# Patient Record
Sex: Female | Born: 1995 | Race: White | Hispanic: No | Marital: Married | Smoking: Current every day smoker
Health system: Southern US, Community
[De-identification: ages and names within clinical notes are randomized; demographics above are authoritative.]

## PROBLEM LIST (undated history)

## (undated) DIAGNOSIS — F32A Depression, unspecified: Secondary | ICD-10-CM

## (undated) DIAGNOSIS — F329 Major depressive disorder, single episode, unspecified: Secondary | ICD-10-CM

## (undated) HISTORY — DX: Major depressive disorder, single episode, unspecified: F32.9

## (undated) HISTORY — DX: Depression, unspecified: F32.A

## (undated) HISTORY — PX: OTHER SURGICAL HISTORY: SHX169

---

## 2016-06-07 ENCOUNTER — Ambulatory Visit (INDEPENDENT_AMBULATORY_CARE_PROVIDER_SITE_OTHER): Payer: BLUE CROSS/BLUE SHIELD | Admitting: Adult Health

## 2016-06-07 ENCOUNTER — Encounter: Payer: Self-pay | Admitting: Gastroenterology

## 2016-06-07 ENCOUNTER — Encounter: Payer: Self-pay | Admitting: Adult Health

## 2016-06-07 VITALS — BP 120/73 | HR 84 | Ht 65.75 in | Wt 136.8 lb

## 2016-06-07 DIAGNOSIS — R634 Abnormal weight loss: Secondary | ICD-10-CM | POA: Insufficient documentation

## 2016-06-07 DIAGNOSIS — R1013 Epigastric pain: Secondary | ICD-10-CM | POA: Diagnosis not present

## 2016-06-07 DIAGNOSIS — Z1321 Encounter for screening for nutritional disorder: Secondary | ICD-10-CM | POA: Insufficient documentation

## 2016-06-07 DIAGNOSIS — Z833 Family history of diabetes mellitus: Secondary | ICD-10-CM | POA: Insufficient documentation

## 2016-06-07 DIAGNOSIS — Z720 Tobacco use: Secondary | ICD-10-CM | POA: Insufficient documentation

## 2016-06-07 DIAGNOSIS — R5383 Other fatigue: Secondary | ICD-10-CM | POA: Insufficient documentation

## 2016-06-07 DIAGNOSIS — Z0000 Encounter for general adult medical examination without abnormal findings: Secondary | ICD-10-CM

## 2016-06-07 LAB — POCT GLYCOSYLATED HEMOGLOBIN (HGB A1C): Hemoglobin A1C: 4.9

## 2016-06-07 NOTE — Assessment & Plan Note (Signed)
Continue healthy eating and regular movement. GI referral placed for unexplained 60 lb. Weight loss and diffuse abdominal pain.

## 2016-06-07 NOTE — Patient Instructions (Signed)
Healthy Eating to Prevent Digestive Disorders The digestive system starts at the mouth and goes all the way down to the rectum. Along the way, your digestive system breaks down the food you eat so you can absorb its nutrients and use them for energy. Digestive disorders can cause gas, bloating, pain, heartburn, and other symptoms. They can prevent your digestive system from doing its job. Healthy eating and a healthy lifestyle can help you avoid many common digestive disorders. What nutrition changes can be made? Start by eating a balanced diet. Eat healthy foods from all the major food groups. These include carbohydrates, fats, and proteins. Other changes you can make include to:  Eat enough fiber. Fiber is a healthy carbohydrate that cleans out your digestive system. Fiber absorbs water and helps you have regular bowel movements. Fiber comes from plants. To get enough fiber in your diet, eat 4-5 servings of fruits, vegetables, and legumes every day. Include beans and whole grains. Most people should get 20?35 grams of fiber each day.  Drink enough water to keep your urine clear or pale yellow. Water helps your body digest food. It can also help prevent constipation.  Avoid fatty proteins. Full-fat dairy products and fatty meats are hard to digest. Fats you want to avoid are those that get solid at room temperature (saturated fats). Instead of eating these kinds of fats, eat plant-based unsaturated fats found in olives, canola, corn, avocado, and nuts.  If you have trouble with gas, belching, or flatulence, avoid gas-producing foods. These include beans, carbonated beverages, cabbage, cauliflower, and broccoli. If you are lactose intolerant, avoid dairy products or choose lactose-free dairy products.  If you have frequent heartburn, stay away from alcohol, caffeine, fatty foods, chocolate, and peppermint. Avoid lying down within two hours of eating a full meal. Overeating and lying down too soon after  a meal can cause heartburn.  Add probiotics to your diet. Healthy digestion depends on having the right balance of good bacteria in your colon. Probiotics can help restore the balance of good bacteria in your digestive system. Probiotics are live active cultures that are found in yogurt, kefir, and cultured foods like sauerkraut and miso. You can also add good bacteria with probiotic supplements.  Make sure to chew your food slowly and completely.  Instead of eating three large meals each day, eat three small meals with three small snacks. What other changes can I make? You can help your digestive system stay healthy by making these lifestyle changes:  Stay active and exercise every day.  Maintain a healthy weight.  Eat on a regular schedule.  Avoid tight-fitting clothes. They can restrict digestion.  If you have frequent heartburn, raise the head of your bed 2-3 inches (5-7.5 cm).  Do not use any tobacco products, such as cigarettes, chewing tobacco, and e-cigarettes. If you need help quitting, ask your health care provider.  Limit alcohol intake to no more than 1 drink a day for nonpregnant women and 2 drinks a day for men. One drink equals 12 oz of beer, 5 oz of wine, or 1 oz of hard liquor.  Avoid stress. Find ways to reduce stress, such as meditation, exercise, or taking time for activities that relax you. Why should I make these changes? Making these changes will help your digestive system function at its best. A healthy digestive system can help you avoid or improve your management of digestive disorders such as:  Bloating, gas, and flatulence.  Heartburn.  Gastroesophageal reflux disease (GERD).    Peptic ulcer disease.  Hemorrhoids.  Diverticulitis.  Constipation.  Diarrhea.  Gall stones  Irritable bowel syndrome.  Malnutrition.  Fatty liver disease. What can happen if changes are not made? Not making these changes could put you at risk for many conditions  caused by a poor diet or an unhealthy weight, such as heart disease, stroke and diabetes. Where can I get more information? Learn more about healthy eating and digestive disorders by visiting these websites:  Academy of Nutrition and Dietetics: SplashPops.cahttp://www.eatright.org/resources/health/wellness/digestive-health  Centers for Disease Control and Prevention: TanClothes.com.cyhttps://health.gov/dietaryguidelines/2015/  U.S. Department of Health and Human Services: CosmeticsCritic.sihttps://www.womenshealth.gov/files/assets/docs/the-healthy-woman/digestive_health.pdf Summary  A heathy diet can help prevent many digestive disorders.  Eat a balanced diet consisting of fiber, unsaturated fats, lean protein, fruits, and vegetables.  Eat three small meals with three small snacks per day.  Drink plenty of water every day.  Get plenty of exercise and maintain a healthy weight. This information is not intended to replace advice given to you by your health care provider. Make sure you discuss any questions you have with your health care provider. Document Released: 05/07/2015 Document Revised: 09/16/2015 Document Reviewed: 12/22/2015 Elsevier Interactive Patient Education  2017 [employer]   Steps to Quit Smoking Smoking tobacco can be bad for your health. It can also affect almost every organ in your body. Smoking puts you and people around you at risk for many serious long-lasting (chronic) diseases. Quitting smoking is hard, but it is one of the best things that you can do for your health. It is never too late to quit. What are the benefits of quitting smoking? When you quit smoking, you lower your risk for getting serious diseases and conditions. They can include:  Lung cancer or lung disease.  Heart disease.  Stroke.  Heart attack.  Not being able to have children (infertility).  Weak bones (osteoporosis) and broken bones (fractures). If you have coughing, wheezing, and shortness of breath, those symptoms may get  better when you quit. You may also get sick less often. If you are pregnant, quitting smoking can help to lower your chances of having a baby of low birth weight. What can I do to help me quit smoking? Talk with your doctor about what can help you quit smoking. Some things you can do (strategies) include:  Quitting smoking totally, instead of slowly cutting back how much you smoke over a period of time.  Going to in-person counseling. You are more likely to quit if you go to many counseling sessions.  Using resources and support systems, such as:  Online chats with a Veterinary surgeoncounselor.  Phone quitlines.  Printed Materials engineerself-help materials.  Support groups or group counseling.  Text messaging programs.  Mobile phone apps or applications.  Taking medicines. Some of these medicines may have nicotine in them. If you are pregnant or breastfeeding, do not take any medicines to quit smoking unless your doctor says it is okay. Talk with your doctor about counseling or other things that can help you. Talk with your doctor about using more than one strategy at the same time, such as taking medicines while you are also going to in-person counseling. This can help make quitting easier. What things can I do to make it easier to quit? Quitting smoking might feel very hard at first, but there is a lot that you can do to make it easier. Take these steps:  Talk to your family and friends. Ask them to support and encourage you.  Call phone quitlines, reach out  to support groups, or work with a Veterinary surgeon.  Ask people who smoke to not smoke around you.  Avoid places that make you want (trigger) to smoke, such as:  Bars.  Parties.  Smoke-break areas at work.  Spend time with people who do not smoke.  Lower the stress in your life. Stress can make you want to smoke. Try these things to help your stress:  Getting regular exercise.  Deep-breathing exercises.  Yoga.  Meditating.  Doing a body scan. To  do this, close your eyes, focus on one area of your body at a time from head to toe, and notice which parts of your body are tense. Try to relax the muscles in those areas.  Download or buy apps on your mobile phone or tablet that can help you stick to your quit plan. There are many free apps, such as QuitGuide from the [employer] developer for Disease Control and Prevention). You can find more support from smokefree.gov and other websites. This information is not intended to replace advice given to you by your health care provider. Make sure you discuss any questions you have with your health care provider. Document Released: 02/04/2009 Document Revised: 12/07/2015 Document Reviewed: 08/25/2014 Elsevier Interactive Patient Education  2017 Elsevier Inc.   GI referral placed for unexplained weight loss and diffuse abdominal pain. Please schedule appt. For fasting labs and appt for pap. Annual follow-up, or sooner if needed.

## 2016-06-07 NOTE — Progress Notes (Signed)
Subjective:    Patient ID: Christie Leavenslisabeth Greer, female    DOB: 06/03/1995, 21 y.o.   MRN: [id number]  HPI:  [name] presents to establish as a new pt. She has never had a PCP and has a few concerning issues: Nov 2016- Feb 2017-unexplained 60 lb weight loss. 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   Her weight has remained 135-145 the last 12 months. She has diffuse abdominal pain that occurs most days and is dull and rated 2/10-6/10.  She says "that sometimes about 30 mins after eating the pain will back off". She denies fever, blood in stool.  She reports intermittent nausea, however denies vomiting. Her menstrual cycles are regular and not excessviely heavy. She does report night sweats a few times a week and am cough that is occasionally productive (clear sputum). She reports daily am BM that is usually hard and "a bit difficult to pass". Prior to onset of weight loss she denies travel outside the [place], being bitten by a tick. She denies hx of ca in immediate family, mother is diabetic but she believes that is r/t her alcoholism. She currently smokes 1/2 pack day and is trying to "quit by myself".   Patient Care Team    Relationship Specialty Notifications Start End  Malon KindleKaty D Bess, NP PCP - General Family Medicine  06/07/16     Patient Active Problem List   Diagnosis Date Noted  . Weight loss 06/07/2016  . Epigastric pain 06/07/2016  . Family history of diabetes mellitus 06/07/2016  . Routine physical examination 06/07/2016  . Encounter for vitamin deficiency screening 06/07/2016  . Fatigue 06/07/2016  . Tobacco use 06/07/2016     History reviewed. No pertinent past medical history.   History reviewed. No pertinent surgical history.   Family History  Problem Relation Age of Onset  . Alcohol abuse Mother   . Diabetes Mother      History  Drug Use No     History  Alcohol Use No     History  Smoking Status  .  Current Every Day Smoker  . Packs/day: 0.50  . Years: 6.00  . Types: Cigarettes  Smokeless Tobacco  . Never Used     No outpatient encounter prescriptions on file as of 06/07/2016.   No facility-administered encounter medications on file as of 06/07/2016.     Allergies: Penicillins  Body mass index is 22.25 kg/m.  Blood pressure 120/73, pulse 84, height 5' 5.75" (1.67 m), weight 136 lb 12.8 oz (62.1 kg), last menstrual period 05/19/2016.    Review of Systems  Constitutional: Positive for appetite change, fatigue and unexpected weight change. Negative for activity change, chills, diaphoresis and fever.       Unexplained 60 lb weight loss from 02/2015-05/2015  HENT: Positive for congestion and postnasal drip.   Eyes: Negative for visual disturbance.  Respiratory: Positive for cough. Negative for choking, chest tightness, shortness of breath, wheezing and stridor.   Cardiovascular: Negative for chest pain, palpitations and leg swelling.  Gastrointestinal: Positive for abdominal pain, constipation and nausea. Negative for abdominal distention, anal bleeding, blood in stool, diarrhea, rectal pain and vomiting.  Endocrine: Positive for heat intolerance. Negative for cold intolerance, polydipsia, polyphagia and polyuria.  Genitourinary: Negative for difficulty urinating, flank pain, menstrual problem, pelvic pain and urgency.  Musculoskeletal: Negative for arthralgias, back pain, gait problem, joint swelling, myalgias, neck pain and neck stiffness.  Skin: Negative for color  change, pallor, rash and wound.  Neurological: Negative for dizziness.  Hematological: Does not bruise/bleed easily.  Psychiatric/Behavioral: Negative for agitation, behavioral problems and sleep disturbance. The patient is not nervous/anxious.        Objective:   Physical Exam  Constitutional: She is oriented to person, place, and time. She appears well-developed and well-nourished. No distress.  HENT:  Head:  Normocephalic and atraumatic.  Right Ear: External ear normal.  Left Ear: External ear normal.  Eyes: Conjunctivae and EOM are normal. Pupils are equal, round, and reactive to light.  Neck: Normal range of motion. Neck supple. No tracheal deviation present.  Cardiovascular: Normal rate, regular rhythm, normal heart sounds and intact distal pulses.   No murmur heard. Pulmonary/Chest: Effort normal and breath sounds normal. No respiratory distress. She has no wheezes. She has no rales. She exhibits no tenderness.  Abdominal: Soft. Bowel sounds are normal. She exhibits no distension and no mass. There is no hepatosplenomegaly. There is tenderness in the right upper quadrant, right lower quadrant, left upper quadrant and left lower quadrant. There is no rigidity, no rebound, no guarding, no CVA tenderness, no tenderness at McBurney's point and negative Murphy's sign. No hernia.  Musculoskeletal: Normal range of motion. She exhibits deformity.  Right rib cage is slightly higher than left rib cage.    Lymphadenopathy:    She has no cervical adenopathy.  Neurological: She is alert and oriented to person, place, and time. She has normal reflexes.  Skin: Skin is warm and dry. No rash noted. She is not diaphoretic. No erythema. There is pallor.  Psychiatric: She has a normal mood and affect. Her behavior is normal. Judgment and thought content normal.  Nursing note and vitals reviewed.         Assessment & Plan:   1. Weight loss   2. Epigastric pain   3. Family history of diabetes mellitus   4. Routine physical examination   5. Encounter for vitamin deficiency screening   6. Fatigue, unspecified type   7. Tobacco use     Weight loss Continue healthy eating and regular movement. GI referral placed for unexplained 60 lb. Weight loss and diffuse abdominal pain.   Epigastric pain Referral for GI placed.  Family history of diabetes mellitus Unexplained 60 lb. Weight loss 02/2015-05/2015  and mother is T2D. A1c in clinic today- 4.9  Routine physical examination Head to Toe physical completed. Please return for fasting labs, pap, Rx for oral BCP.   Encounter for vitamin deficiency screening Please return for lab draw.  Fatigue Increase daily water intake and regular exercise.  Tobacco use 6 year pack hx.  At most 1 pack/day, currently 1/2/day now. Declines medication to aid in cessation-wants "to do it on my own"-GREAT!    FOLLOW-UP:  Return in about 1 year (around 06/07/2017) for Regular Follow Up, Fasting Lab Draw.

## 2016-06-07 NOTE — Assessment & Plan Note (Signed)
Increase daily water intake and regular exercise.

## 2016-06-07 NOTE — Assessment & Plan Note (Signed)
Head to Toe physical completed. Please return for fasting labs, pap, Rx for oral BCP.

## 2016-06-07 NOTE — Assessment & Plan Note (Signed)
Please return for lab draw.

## 2016-06-07 NOTE — Assessment & Plan Note (Signed)
Unexplained 60 lb. Weight loss 02/2015-05/2015 and mother is T2D. A1c in clinic today- 4.9

## 2016-06-07 NOTE — Assessment & Plan Note (Signed)
Referral for GI placed

## 2016-06-07 NOTE — Assessment & Plan Note (Signed)
6 year pack hx.  At most 1 pack/day, currently 1/2/day now. Declines medication to aid in cessation-wants "to do it on my own"-GREAT!

## 2016-06-09 ENCOUNTER — Other Ambulatory Visit (INDEPENDENT_AMBULATORY_CARE_PROVIDER_SITE_OTHER): Payer: BLUE CROSS/BLUE SHIELD

## 2016-06-09 DIAGNOSIS — Z1321 Encounter for screening for nutritional disorder: Secondary | ICD-10-CM

## 2016-06-09 DIAGNOSIS — R634 Abnormal weight loss: Secondary | ICD-10-CM

## 2016-06-09 DIAGNOSIS — Z0000 Encounter for general adult medical examination without abnormal findings: Secondary | ICD-10-CM

## 2016-06-12 ENCOUNTER — Encounter: Payer: Self-pay | Admitting: Adult Health

## 2016-06-12 ENCOUNTER — Ambulatory Visit (INDEPENDENT_AMBULATORY_CARE_PROVIDER_SITE_OTHER): Payer: BLUE CROSS/BLUE SHIELD | Admitting: Adult Health

## 2016-06-12 ENCOUNTER — Other Ambulatory Visit (HOSPITAL_COMMUNITY)
Admission: RE | Admit: 2016-06-12 | Discharge: 2016-06-12 | Disposition: A | Discharge status: Home / Self Care | Payer: BLUE CROSS/BLUE SHIELD | Source: Ambulatory Visit | Attending: Adult Health | Admitting: Adult Health

## 2016-06-12 VITALS — BP 122/79 | HR 88 | Ht 65.75 in | Wt 137.3 lb

## 2016-06-12 DIAGNOSIS — Z720 Tobacco use: Secondary | ICD-10-CM

## 2016-06-12 DIAGNOSIS — Z833 Family history of diabetes mellitus: Secondary | ICD-10-CM

## 2016-06-12 DIAGNOSIS — R1013 Epigastric pain: Secondary | ICD-10-CM | POA: Diagnosis not present

## 2016-06-12 DIAGNOSIS — Z124 Encounter for screening for malignant neoplasm of cervix: Secondary | ICD-10-CM

## 2016-06-12 DIAGNOSIS — Z0000 Encounter for general adult medical examination without abnormal findings: Secondary | ICD-10-CM

## 2016-06-12 DIAGNOSIS — Z01419 Encounter for gynecological examination (general) (routine) without abnormal findings: Secondary | ICD-10-CM | POA: Insufficient documentation

## 2016-06-12 DIAGNOSIS — Z3041 Encounter for surveillance of contraceptive pills: Secondary | ICD-10-CM

## 2016-06-12 DIAGNOSIS — Z1321 Encounter for screening for nutritional disorder: Secondary | ICD-10-CM

## 2016-06-12 DIAGNOSIS — Z114 Encounter for screening for human immunodeficiency virus [HIV]: Secondary | ICD-10-CM

## 2016-06-12 DIAGNOSIS — R5383 Other fatigue: Secondary | ICD-10-CM

## 2016-06-12 MED ORDER — NORGESTIM-ETH ESTRAD TRIPHASIC 0.18/0.215/0.25 MG-25 MCG PO TABS: 1.0000 | ORAL_TABLET | Freq: Every day | ORAL | 11 refills | Status: DC

## 2016-06-12 NOTE — Progress Notes (Signed)
Subjective:    Patient ID: Christie Greer, female    DOB: 1996-04-01, 21 y.o.   MRN: [id number]  HPI :  Christie Greer presents for pap smear, breast exam, and Rx for oral birth control.  She denies acute breast sx's, or GI/GU sx's.  She is continuing to reduce daily tobacco use, down to 8 cigarettes daily-keep up the great work!  She has appointment scheduled for GI specialist.     Patient Care Team    Relationship Specialty Notifications Start End  Malon KindleKaty D Bess, NP PCP - General Family Medicine  06/07/16     Patient Active Problem List   Diagnosis Date Noted  . Weight loss 06/07/2016  . Epigastric pain 06/07/2016  . Family history of diabetes mellitus 06/07/2016  . Routine physical examination 06/07/2016  . Encounter for vitamin deficiency screening 06/07/2016  . Fatigue 06/07/2016  . Tobacco use 06/07/2016     History reviewed. No pertinent past medical history.   History reviewed. No pertinent surgical history.   Family History  Problem Relation Age of Onset  . Alcohol abuse Mother   . Diabetes Mother      History  Drug Use No     History  Alcohol Use No     History  Smoking Status  . Current Every Day Smoker  . Packs/day: 0.50  . Years: 6.00  . Types: Cigarettes  Smokeless Tobacco  . Never Used     Outpatient Encounter Prescriptions as of 06/12/2016  Medication Sig  . Norgestimate-Ethinyl Estradiol Triphasic (ORTHO TRI-CYCLEN LO) 0.18/0.215/0.25 MG-25 MCG tab Take 1 tablet by mouth daily.   No facility-administered encounter medications on file as of 06/12/2016.     Allergies: Penicillins  Body mass index is 22.33 kg/m.  Blood pressure 122/79, pulse 88, height 5' 5.75" (1.67 m), weight 137 lb 4.8 oz (62.3 kg), last menstrual period 05/19/2016.    Review of Systems  Constitutional: Positive for unexpected weight change. Negative for activity change, appetite change, chills, diaphoresis, fatigue and fever.  Eyes: Negative for visual  disturbance.  Respiratory: Negative for cough and shortness of breath.   Cardiovascular: Negative for chest pain and leg swelling.  Gastrointestinal: Positive for nausea. Negative for abdominal distention, abdominal pain, blood in stool, constipation, diarrhea, rectal pain and vomiting.  Endocrine: Negative for cold intolerance, heat intolerance, polydipsia, polyphagia and polyuria.  Genitourinary: Negative for difficulty urinating, dyspareunia, dysuria, flank pain, genital sores, hematuria, menstrual problem, pelvic pain, urgency, vaginal bleeding, vaginal discharge and vaginal pain.  Skin: Negative for color change, pallor, rash and wound.       Objective:   Physical Exam  Constitutional: She is oriented to person, place, and time. She appears well-developed and well-nourished. No distress.  HENT:  Head: Normocephalic and atraumatic.  Eyes: Conjunctivae and EOM are normal. Pupils are equal, round, and reactive to light.  Cardiovascular: Normal rate, regular rhythm, normal heart sounds and intact distal pulses.   Pulmonary/Chest: Effort normal and breath sounds normal. She has no wheezes. She exhibits no tenderness.  Abdominal: Soft. Bowel sounds are normal. She exhibits no distension. There is no tenderness. There is no rebound and no guarding.  Genitourinary: Uterus normal. No breast swelling, tenderness, discharge or bleeding. No erythema, tenderness or bleeding in the vagina. No foreign body in the vagina. No vaginal discharge found.  Genitourinary Comments: Pap/Pelvic/Breast examination performed with chaperone in room.    Neurological: She is alert and oriented to person, place, and time. She has normal reflexes.  Skin: Skin is warm and dry. No rash noted. She is not diaphoretic. No erythema. No pallor.  Psychiatric: She has a normal mood and affect. Her behavior is normal. Judgment and thought content normal.  Nursing note and vitals reviewed.         Assessment & Plan:   1.  Epigastric pain   2. Family history of diabetes mellitus   3. Encounter for vitamin deficiency screening   4. Routine physical examination   5. Other fatigue   6. Screening for cervical cancer   7. Encounter for screening for HIV   8. Tobacco use   9. Surveillance for birth control, oral contraceptives     Screening for cervical cancer Pap/Pelvic/Breast examination performed with chaperone in room. No vaginal lesions/drainage noted.   Surveillance for birth control, oral contraceptives Rx provided, discussed how to take medication correctly. Discussed at length the CV risks of oral BCP and tobacco use. She has been reducing tobacco use to completed cessation, currently only smoking 8 cigarettes daily. She denies personal or family hx of migraines or DVT.  Routine physical examination Performed manual breast examination. Instructed on how to perform monthly BSE.     FOLLOW-UP:  Return if symptoms worsen or fail to improve.

## 2016-06-12 NOTE — Assessment & Plan Note (Signed)
Rx provided, discussed how to take medication correctly. Discussed at length the CV risks of oral BCP and tobacco use. She has been reducing tobacco use to completed cessation, currently only smoking 8 cigarettes daily. She denies personal or family hx of migraines or DVT.

## 2016-06-12 NOTE — Assessment & Plan Note (Signed)
Performed manual breast examination. Instructed on how to perform monthly BSE.

## 2016-06-12 NOTE — Patient Instructions (Addendum)
Hormonal Contraception Information Introduction Estrogen and progesterone (progestin) are hormones used in many forms of birth control (contraception). These two hormones make up most hormonal contraceptives. Hormonal contraceptives use either:  A combination of estrogen hormone and progesterone hormone in one of these forms:  Pill. Pills come in various combinations of active hormone pills and nonhormonal pills. Different combinations of pills may give you a period once a month, once every 3 months, or no period at all. It is important to take the pills the same time each day.  Patch. The patch is placed on the lower abdomen every week for 3 weeks. On the fourth week, the patch is not placed.  Vaginal ring. The ring is placed in the vagina and left there for 3 weeks. It is then removed for 1 week.  Progesterone alone in one of these forms:  Pill. Hormone pills are taken every day of the cycle.  Intrauterine device (IUD). The IUD is inserted during a menstrual period and removed or replaced every 5 years or sooner.  Implant. Plastic rods are placed under the skin of the upper arm. They are removed or replaced every 3 years or sooner.  Injection. The injection is given once every 90 days. Pregnancy can still occur with any of these hormonal contraceptive methods. If you have any suspicion that you might be pregnant, take a pregnancy test and talk to your health care provider. Estrogen and progesterone contraceptives Estrogen and progesterone contraceptives can prevent pregnancy by:  Stopping the release of an egg (ovulation).  Thickening the mucus of the cervix, making it difficult for sperm to enter the uterus.  Changing the lining of the uterus. This change makes it more difficult for an egg to implant. Progesterone contraceptives Progesterone-only contraceptives can prevent pregnancy by:  Blocking ovulation. This occurs in many women, but some women will continue to  ovulate.  Preventing the entry of sperm into the uterus by keeping the cervical mucus thick and sticky.  Changing the lining of the uterus. This change makes it more difficult for an egg to implant. Side effects Talk to your health care provider about what side effects may affect you. If you develop persistent side effects or if the effects are severe, talk to your health care provider.  Estrogen. Side effects from estrogen occur more often in the first 2-3 months. They include:  Progesterone. Side effects of progesterone can vary. They include: Questions to ask This information is not intended to replace advice given to you by your health care provider. Make sure you discuss any questions you have with your health care provider. Document Released: 04/30/2007 Document Revised: 01/12/2016 Document Reviewed: 09/22/2012  2017 Elsevier   Tobacco Use Disorder Tobacco use disorder (TUD) is a mental disorder. It is the long-term use of tobacco in spite of related health problems or difficulty with normal life activities. Tobacco is most commonly smoked as cigarettes and less commonly as cigars or pipes. Smokeless chewing tobacco and snuff are also popular. People with TUD get a feeling of extreme pleasure (euphoria) from using tobacco and have a desire to use it again and again. Repeated use of tobacco can cause problems. The addictive effects of tobacco are due mainly tothe ingredient nicotine. Nicotine also causes a rush of adrenaline (epinephrine) in the body. This leads to increased blood pressure, heart rate, and breathing rate. These changes may cause problems for people with high blood pressure, weak hearts, or lung disease. High doses of nicotine in children and pets  can lead to seizures and death. Tobacco contains a number of other unsafe chemicals. These chemicals are especially harmful when inhaled as smoke and can damage almost every organ in the body. Smokers live shorter lives than  nonsmokers and are at risk of dying from a number of diseases and cancers. Tobacco smoke can also cause health problems for nonsmokers (due to inhaling secondhand smoke). Smoking is also a fire hazard. TUD usually starts in the late teenage years and is most common in young adults between the ages of 4218 and 25 years. People who start smoking earlier in life are more likely to continue smoking as adults. TUD is somewhat more common in men than women. People with TUD are at higher risk for using alcohol and other drugs of abuse. What increases the risk? Risk factors for TUD include:  Having family members with the disorder.  Being around people who use tobacco.  Having an existing mental health issue such as schizophrenia, depression, bipolar disorder, ADHD, or posttraumatic stress disorder (PTSD). What are the signs or symptoms? People with tobacco use disorder have two or more of the following signs and symptoms within 12 months:  Use of more tobacco over a longer period than intended.  Not able to cut down or control tobacco use.  A lot of time spent obtaining or using tobacco.  Strong desire or urge to use tobacco (craving). Cravings may last for 6 months or longer after quitting.  Use of tobacco even when use leads to major problems at work, school, or home.  Use of tobacco even when use leads to relationship problems.  Giving up or cutting down on important life activities because of tobacco use.  Repeatedly using tobacco in situations where it puts you or others in physical danger, like smoking in bed.  Use of tobacco even when it is known that a physical or mental problem is likely related to tobacco use.  Physical problems are numerous and may include chronic bronchitis, emphysema, lung and other cancers, gum disease, high blood pressure, heart disease, and stroke.  Mental problems caused by tobacco may include difficulty sleeping and anxiety.  Need to use greater amounts of  tobacco to get the same effect. This means you have developed a tolerance.  Withdrawal symptoms as a result of stopping or rapidly cutting back use. These symptoms may last a month or more after quitting and include the following:  Depressed, anxious, or irritable mood.  Difficulty concentrating.  Increased appetite.  Restlessness or trouble sleeping.  Use of tobacco to avoid withdrawal symptoms. How is this diagnosed? Tobacco use disorder is diagnosed by your health care provider. A diagnosis may be made by:  Your health care provider asking questions about your tobacco use and any problems it may be causing.  A physical exam.  Lab tests.  You may be referred to a mental health professional or addiction specialist. The severity of tobacco use disorder depends on the number of signs and symptoms you have:  Mild-Two or three symptoms.  Moderate-Four or five symptoms.  Severe-Six or more symptoms. How is this treated? Many people with tobacco use disorder are unable to quit on their own and need help. Treatment options include the following:  Nicotine replacement therapy (NRT). NRT provides nicotine without the other harmful chemicals in tobacco. NRT gradually lowers the dosage of nicotine in the body and reduces withdrawal symptoms. NRT is available in over-the-counter forms (gum, lozenges, and skin patches) as well as prescription forms (mouth  inhaler and nasal spray).  Medicines.This may include:  Antidepressant medicine that may reduce nicotine cravings.  A medicine that acts on nicotine receptors in the brain to reduce cravings and withdrawal symptoms. It may also block the effects of tobacco in people with TUD who relapse.  Counseling or talk therapy. A form of talk therapy called behavioral therapy is commonly used to treat people with TUD. Behavioral therapy looks at triggers for tobacco use, how to avoid them, and how to cope with cravings. It is most effective in  person or by phone but is also available in self-help forms (books and Internet websites).  Support groups. These provide emotional support, advice, and guidance for quitting tobacco. The most effective treatment for TUD is usually a combination of medicine, talk therapy, and support groups. Follow these instructions at home:  Keep all follow-up visits as directed by your health care provider. This is important.  Take medicines only as directed by your health care provider.  Check with your health care provider before starting new prescription or over-the-counter medicines. Contact a health care provider if:  You are not able to take your medicines as prescribed.  Treatment is not helping your TUD and your symptoms get worse. Get help right away if:  You have serious thoughts about hurting yourself or others.  You have trouble breathing, chest pain, sudden weakness, or sudden numbness in part of your body. This information is not intended to replace advice given to you by your health care provider. Make sure you discuss any questions you have with your health care provider. Document Released: 12/15/2003 Document Revised: 12/12/2015 Document Reviewed: 06/06/2013 Elsevier Interactive Patient Education  2017 [employer].  Again discussed the importance of completely stopped all tobacco use-down to only 8 cigarettes daily. Will call when lab results are available.

## 2016-06-12 NOTE — Assessment & Plan Note (Signed)
Pap/Pelvic/Breast examination performed with chaperone in room. No vaginal lesions/drainage noted.

## 2016-06-13 LAB — CBC WITH DIFFERENTIAL/PLATELET
BASOS ABS: 0 10*3/uL (ref 0.0–0.2)
Basos: 0 %
EOS (ABSOLUTE): 0.1 10*3/uL (ref 0.0–0.4)
Eos: 3 %
HEMOGLOBIN: 14.1 g/dL (ref 11.1–15.9)
Hematocrit: 41.6 % (ref 34.0–46.6)
IMMATURE GRANS (ABS): 0 10*3/uL (ref 0.0–0.1)
IMMATURE GRANULOCYTES: 0 %
LYMPHS: 24 %
Lymphocytes Absolute: 1.3 10*3/uL (ref 0.7–3.1)
MCH: 28.5 pg (ref 26.6–33.0)
MCHC: 33.9 g/dL (ref 31.5–35.7)
MCV: 84 fL (ref 79–97)
MONOCYTES: 6 %
Monocytes Absolute: 0.3 10*3/uL (ref 0.1–0.9)
NEUTROS ABS: 3.5 10*3/uL (ref 1.4–7.0)
Neutrophils: 67 %
Platelets: 260 10*3/uL (ref 150–379)
RBC: 4.95 x10E6/uL (ref 3.77–5.28)
RDW: 13.7 % (ref 12.3–15.4)
WBC: 5.3 10*3/uL (ref 3.4–10.8)

## 2016-06-13 LAB — LIPID PANEL
CHOL/HDL RATIO: 2.8 ratio (ref 0.0–4.4)
Cholesterol, Total: 141 mg/dL (ref 100–199)
HDL: 51 mg/dL (ref >39)
LDL Calculated: 80 mg/dL (ref 0–99)
TRIGLYCERIDES: 51 mg/dL (ref 0–149)
VLDL CHOLESTEROL CAL: 10 mg/dL (ref 5–40)

## 2016-06-13 LAB — COMPREHENSIVE METABOLIC PANEL
ALBUMIN: 4.7 g/dL (ref 3.5–5.5)
ALT: 8 IU/L (ref 0–32)
AST: 16 IU/L (ref 0–40)
Albumin/Globulin Ratio: 2.2 (ref 1.2–2.2)
Alkaline Phosphatase: 48 IU/L (ref 39–117)
BUN / CREAT RATIO: 12 (ref 9–23)
BUN: 8 mg/dL (ref 6–20)
Bilirubin Total: 0.4 mg/dL (ref 0.0–1.2)
CALCIUM: 9.9 mg/dL (ref 8.7–10.2)
CO2: 24 mmol/L (ref 18–29)
CREATININE: 0.69 mg/dL (ref 0.57–1.00)
Chloride: 100 mmol/L (ref 96–106)
GFR calc non Af Amer: 124 mL/min/{1.73_m2} (ref >59)
GFR, EST AFRICAN AMERICAN: 143 mL/min/{1.73_m2} (ref >59)
GLUCOSE: 75 mg/dL (ref 65–99)
Globulin, Total: 2.1 g/dL (ref 1.5–4.5)
Potassium: 4.7 mmol/L (ref 3.5–5.2)
Sodium: 141 mmol/L (ref 134–144)
TOTAL PROTEIN: 6.8 g/dL (ref 6.0–8.5)

## 2016-06-13 LAB — TSH: TSH: 0.9 u[IU]/mL (ref 0.450–4.500)

## 2016-06-13 LAB — HIV ANTIBODY (ROUTINE TESTING W REFLEX): HIV Screen 4th Generation wRfx: NONREACTIVE

## 2016-06-13 LAB — VITAMIN D 25 HYDROXY (VIT D DEFICIENCY, FRACTURES): VIT D 25 HYDROXY: 28.3 ng/mL — AB (ref 30.0–100.0)

## 2016-06-14 LAB — CYTOLOGY - PAP
Adequacy: ABSENT
Diagnosis: NEGATIVE

## 2016-07-11 ENCOUNTER — Encounter: Payer: Self-pay | Admitting: Gastroenterology

## 2016-07-11 ENCOUNTER — Ambulatory Visit (INDEPENDENT_AMBULATORY_CARE_PROVIDER_SITE_OTHER): Payer: BLUE CROSS/BLUE SHIELD | Admitting: Gastroenterology

## 2016-07-11 VITALS — BP 90/62 | HR 72 | Ht 65.75 in | Wt 137.0 lb

## 2016-07-11 DIAGNOSIS — R1012 Left upper quadrant pain: Secondary | ICD-10-CM

## 2016-07-11 DIAGNOSIS — R634 Abnormal weight loss: Secondary | ICD-10-CM | POA: Diagnosis not present

## 2016-07-11 DIAGNOSIS — R194 Change in bowel habit: Secondary | ICD-10-CM

## 2016-07-11 NOTE — Progress Notes (Signed)
[facility] Consult Note:  History: Christie Greer 07/11/2016  Referring physician: Laurance Flatten, NP  Reason for consult/chief complaint: Abdominal Pain (left side since 02/2015 and rapid wt loss over 4 mos. of 60 lbs)   Subjective  HPI:  This is a 21 year old woman referred for abdominal pain and previous weight loss. She reports that in late 2016 and early 2017 she lost 60 pounds unintentionally. She also recalls the beginning of some frequent left-sided abdominal pain during that same period. The weight loss was of unknown cause, and then it stopped. Her weight has been stable for years now. However, she continues to have frequent left-sided abdominal pain that would be sometimes sharp and sometimes dull. It is nonradiating, seems unrelated to any clear triggers such as eating time of day position or bowel movements. Curiously, when she has a large meal it seems to get better. She tends toward constipation with some diarrhea perhaps every couple of weeks. There's been no rectal bleeding, she denies early satiety, nausea, vomiting, dysphagia.   ROS:  Review of Systems  Constitutional: Negative for appetite change and unexpected weight change.  HENT: Negative for mouth sores and voice change.   Eyes: Negative for pain and redness.  Respiratory: Negative for cough and shortness of breath.   Cardiovascular: Negative for chest pain and palpitations.  Genitourinary: Negative for dysuria and hematuria.  Musculoskeletal: Negative for arthralgias and myalgias.  Skin: Negative for pallor and rash.  Neurological: Negative for weakness and headaches.  Hematological: Negative for adenopathy.     Past Medical History: Past Medical History:  Diagnosis Date  . Depression      Past Surgical History: Past Surgical History:  Procedure Laterality Date  . None       Family History: Family History  Problem Relation Age of Onset  . Alcohol abuse Mother    recovering alcoholic  . Diabetes Mother   . Colon cancer Paternal Grandmother   . Breast cancer Paternal Grandmother     Social History: Social History   Social History  . Marital status: Single    Spouse name: N/A  . Number of children: 0  . Years of education: N/A   Occupational History  . office manager    Social History Main Topics  . Smoking status: Current Every Day Smoker    Packs/day: 0.50    Years: 6.00    Types: Cigarettes  . Smokeless tobacco: Never Used  . Alcohol use No     Comment: one drink every few weeks  . Drug use: Yes    Types: Marijuana     Comment: from time to time  . Sexual activity: Yes    Birth control/ protection: Condom   Other Topics Concern  . None   Social History Narrative  . None    Allergies: Allergies  Allergen Reactions  . Penicillins Other (See Comments)    Never has taken but 2 sisters with reactions    Outpatient Meds: Current Outpatient Prescriptions  Medication Sig Dispense Refill  . Norgestimate-Ethinyl Estradiol Triphasic (ORTHO TRI-CYCLEN LO) 0.18/0.215/0.25 MG-25 MCG tab Take 1 tablet by mouth daily. 1 Package 11   No current facility-administered medications for this visit.       ___________________________________________________________________ Objective   Exam:  BP 90/62   Pulse 72   Ht 5' 5.75" (1.67 m)   Wt 137 lb (62.1 kg)   BMI 22.28 kg/m    General: this is a(n) well-appearing young woman, good muscle mass  Eyes: sclera anicteric, no redness  ENT: oral mucosa moist without lesions, no cervical or supraclavicular lymphadenopathy, good dentition  CV: RRR without murmur, S1/S2, no JVD, no peripheral edema  Resp: clear to auscultation bilaterally, normal RR and effort noted  GI: soft, no tenderness, with active bowel sounds. No guarding or palpable organomegaly noted. She has extra skin on the abdominal wall and flank says if she had lost a significant amount of weight.  Skin; warm and  dry, no rash or jaundice noted  Neuro: awake, alert and oriented x 3. Normal gross motor function and fluent speech  Labs:  CMP Latest Ref Rng & Units 06/12/2016  Glucose 65 - 99 mg/dL 75  BUN 6 - 20 mg/dL 8  Creatinine 1.610.57 - 0.961.00 mg/dL 0.450.69  Sodium 409134 - 811144 mmol/L 141  Potassium 3.5 - 5.2 mmol/L 4.7  Chloride 96 - 106 mmol/L 100  CO2 18 - 29 mmol/L 24  Calcium 8.7 - 10.2 mg/dL 9.9  Total Protein 6.0 - 8.5 g/dL 6.8  Total Bilirubin 0.0 - 1.2 mg/dL 0.4  Alkaline Phos 39 - 117 IU/L 48  AST 0 - 40 IU/L 16  ALT 0 - 32 IU/L 8   nml TSH  CBC Latest Ref Rng & Units 06/12/2016  WBC 3.4 - 10.8 x10E3/uL 5.3  Hematocrit 34.0 - 46.6 % 41.6  Platelets 150 - 379 x10E3/uL 260    Assessment: Encounter Diagnoses  Name Primary?  . LUQ pain Yes  . Weight loss   . Altered bowel habits     It is not clear if the abdominal pain and weight loss are related. They would seem unlikely to be since the weight loss, though substantial, stop that for a few months and the abdominal pain continues. She does not seem to have the type of diarrhea that would indicate malabsorption. We discussed how this could be mechanical or functional. Seems less likely to be inflammatory such as Crohn's without significant diarrhea. I would mainly like to be sure that it is nothing harmful. Plan:  CT abdomen and pelvis with oral and IV contrast.  Thank you for the courtesy of this consult.  Please call me with any questions or concerns.  Charlie PitterHenry L Danis III  CC: Laurance FlattenKaty Bess, NP

## 2016-07-11 NOTE — Patient Instructions (Addendum)
If you are age 23 or older, your body mass index should be between 23-30. Your Body mass index is 22.28 kg/m. If this is out of the aforementioned range listed, please consider follow up with your Primary Care Provider.  If you are age 39 or younger, your body mass index should be between 19-25. Your Body mass index is 22.28 kg/m. If this is out of the aformentioned range listed, please consider follow up with your Primary Care Provider.   You have been scheduled for a CT scan of the abdomen and pelvis at [facility] ([address]---this is in the same building as Press photographer).   You are scheduled on    07-21-2016     at      130pm    . You should arrive 15 minutes prior to your appointment time for registration. Please follow the written instructions below on the day of your exam:  WARNING: IF YOU ARE ALLERGIC TO IODINE/X-RAY DYE, PLEASE NOTIFY RADIOLOGY IMMEDIATELY AT [phone]! YOU WILL BE GIVEN A 13 HOUR PREMEDICATION PREP.  1) Do not eat or drink anything after    930am              (4 hours prior to your test) 2) You have been given 2 bottles of oral contrast to drink. The solution may taste               better if refrigerated, but do NOT add ice or any other liquid to this solution. Shake             well before drinking.    Drink 1 bottle of contrast @ 1130am              (2 hours prior to your exam)  Drink 1 bottle of contrast @  1230pm       (1 hour prior to your exam)  You may take any medications as prescribed with a small amount of water except for the following: Metformin, Glucophage, Glucovance, Avandamet, Riomet, Fortamet, Actoplus Met, Janumet, Glumetza or Metaglip. The above medications must be held the day of the exam AND 48 hours after the exam.  The purpose of you drinking the oral contrast is to aid in the visualization of your intestinal tract. The contrast solution may cause some diarrhea. Before your exam is started, you will be given a small  amount of fluid to drink. Depending on your individual set of symptoms, you may also receive an intravenous injection of x-ray contrast/dye. Plan on being at [facility] for 30 minutes or longer, depending on the type of exam you are having performed.  This test typically takes 30-45 minutes to complete.  If you have any questions regarding your exam or if you need to reschedule, you may call the CT department at [phone] between the hours of 8:00 am and 5:00 pm, Monday-Friday.  ________________________________________________________________________  Thank you for choosing  GI  Dr Wilfrid Lund III

## 2016-07-21 ENCOUNTER — Ambulatory Visit (INDEPENDENT_AMBULATORY_CARE_PROVIDER_SITE_OTHER)
Admission: RE | Admit: 2016-07-21 | Discharge: 2016-07-21 | Disposition: A | Discharge status: Home / Self Care | Payer: BLUE CROSS/BLUE SHIELD | Source: Ambulatory Visit | Attending: Gastroenterology | Admitting: Gastroenterology

## 2016-07-21 DIAGNOSIS — R634 Abnormal weight loss: Secondary | ICD-10-CM | POA: Diagnosis not present

## 2016-07-21 DIAGNOSIS — R1012 Left upper quadrant pain: Secondary | ICD-10-CM

## 2016-07-21 DIAGNOSIS — R194 Change in bowel habit: Secondary | ICD-10-CM | POA: Diagnosis not present

## 2016-07-21 MED ORDER — IOPAMIDOL (ISOVUE-300) INJECTION 61%: 100.0000 mL | Freq: Once | INTRAVENOUS | Status: DC | PRN

## 2017-04-29 NOTE — Progress Notes (Signed)
Subjective:    Patient ID: Christie Greer, female    DOB: [birth year], 24 y.o.   MRN: [id number]  HPI :  06/12/16 OV: Christie Greer presents for pap smear, breast exam, and Rx for oral birth control.  She denies acute breast sx's, or GI/GU sx's.  She is continuing to reduce daily tobacco use, down to 8 cigarettes daily-keep up the great work!  She has appointment scheduled for GI specialist.    05/01/17 OV: Christie Greer presents for annual f/u-  Her GI sx's have stabilized, now has one well formed BM each morning without blood/mucus.   She denies abdominal pain and is able to eat a heart healthy diet. Her wt is stable- 145#, BMI 23.7 She continues to use tobacco 1/2 pack a day but has set a quit date of Spring 2019.  If she is unable to quit herself she will reach out and we will discuss assistive medications. She denies any acute complaints today.   Patient Care Team    Relationship Specialty Notifications Start End  Julaine Fusi, NP PCP - General Family Medicine  06/07/16     Patient Active Problem List   Diagnosis Date Noted  . Vitamin D deficiency 05/01/2017  . Healthcare maintenance 05/01/2017  . Screening for cervical cancer 06/12/2016  . Surveillance for birth control, oral contraceptives 06/12/2016  . Epigastric pain 06/07/2016  . Family history of diabetes mellitus 06/07/2016  . Routine physical examination 06/07/2016  . Encounter for vitamin deficiency screening 06/07/2016  . Fatigue 06/07/2016  . Tobacco use 06/07/2016     Past Medical History:  Diagnosis Date  . Depression      Past Surgical History:  Procedure Laterality Date  . None       Family History  Problem Relation Age of Onset  . Alcohol abuse Mother        recovering alcoholic  . Diabetes Mother   . Colon cancer Paternal Grandmother   . Breast cancer Paternal Grandmother      Social History   Substance and Sexual Activity  Drug Use Yes  . Types: Marijuana   Comment: from time  to time     Social History   Substance and Sexual Activity  Alcohol Use No   Comment: one drink every few weeks     Social History   Tobacco Use  Smoking Status Current Every Day Smoker  . Packs/day: 0.50  . Years: 6.00  . Pack years: 3.00  . Types: Cigarettes  Smokeless Tobacco Never Used     Outpatient Encounter Medications as of 05/01/2017  Medication Sig  . Norgestimate-Ethinyl Estradiol Triphasic (ORTHO TRI-CYCLEN LO) 0.18/0.215/0.25 MG-25 MCG tab Take 1 tablet by mouth daily.  . [DISCONTINUED] Norgestimate-Ethinyl Estradiol Triphasic (ORTHO TRI-CYCLEN LO) 0.18/0.215/0.25 MG-25 MCG tab Take 1 tablet by mouth daily.   No facility-administered encounter medications on file as of 05/01/2017.     Allergies: Penicillins  Body mass index is 23.73 kg/m.  Blood pressure 92/61, pulse 65, height 5' 5.75" (1.67 m), weight 145 lb 14.4 oz (66.2 kg), last menstrual period 04/13/2017, SpO2 100 %.    Review of Systems  Constitutional: Negative for activity change, appetite change, chills, diaphoresis, fatigue, fever and unexpected weight change.  Eyes: Negative for visual disturbance.  Respiratory: Negative for cough and shortness of breath.   Cardiovascular: Negative for chest pain and leg swelling.  Gastrointestinal: Negative for abdominal distention, abdominal pain, blood in stool, constipation, diarrhea, nausea, rectal pain and vomiting.  Endocrine:  Negative for cold intolerance, heat intolerance, polydipsia, polyphagia and polyuria.  Genitourinary: Negative for difficulty urinating, dyspareunia, dysuria, flank pain, genital sores, hematuria, menstrual problem, pelvic pain, urgency, vaginal bleeding, vaginal discharge and vaginal pain.  Skin: Negative for color change, pallor, rash and wound.  Neurological: Negative for dizziness and headaches.  Hematological: Does not bruise/bleed easily.  Psychiatric/Behavioral: Negative for decreased concentration, hallucinations,  self-injury, sleep disturbance and suicidal ideas. The patient is not nervous/anxious and is not hyperactive.        Objective:   Physical Exam  Constitutional: She is oriented to person, place, and time. She appears well-developed and well-nourished. No distress.  HENT:  Head: Normocephalic and atraumatic.  Eyes: Conjunctivae and EOM are normal. Pupils are equal, round, and reactive to light.  Cardiovascular: Normal rate, regular rhythm, normal heart sounds and intact distal pulses.  Pulmonary/Chest: Effort normal and breath sounds normal. No respiratory distress. She has no wheezes. She has no rales. She exhibits no tenderness.  Abdominal: Soft. Bowel sounds are normal. She exhibits no distension. There is no tenderness. There is no rebound and no guarding.  Genitourinary: No breast swelling, tenderness, discharge or bleeding. No erythema, tenderness or bleeding in the vagina. No foreign body in the vagina.  Neurological: She is alert and oriented to person, place, and time. Coordination normal.  Skin: Skin is warm and dry. No rash noted. She is not diaphoretic. No erythema. No pallor.  Psychiatric: She has a normal mood and affect. Her behavior is normal. Judgment and thought content normal.  Nursing note and vitals reviewed.         Assessment & Plan:   1. Family history of diabetes mellitus   2. Vitamin D deficiency   3. Routine physical examination   4. Healthcare maintenance   5. Tobacco use     Vitamin D deficiency Vit d level drawn today  Tobacco use Has quit date of Spring 2019 If unable to quit by self, she will reach out to discuss assistive rx's  Healthcare maintenance Birth Control Refilled for one yr Increase water intake, strive for at least 70 ounces/day.   Follow Heart Healthy diet Increase regular exercise.  Recommend at least 30 minutes daily, 5 days per week of walking, jogging, biking, swimming, YouTube/Pinterest workout videos. YOU CAN QUIT  SMOKING!!!! We will call when your lab results are available. Please schedule complete physical this summer.    FOLLOW-UP:  Return in about 6 months (around 10/29/2017) for CPE.

## 2017-05-01 ENCOUNTER — Encounter: Payer: Self-pay | Admitting: Adult Health

## 2017-05-01 ENCOUNTER — Ambulatory Visit (INDEPENDENT_AMBULATORY_CARE_PROVIDER_SITE_OTHER): Payer: 59 | Admitting: Adult Health

## 2017-05-01 VITALS — BP 92/61 | HR 65 | Ht 65.75 in | Wt 145.9 lb

## 2017-05-01 DIAGNOSIS — E559 Vitamin D deficiency, unspecified: Secondary | ICD-10-CM

## 2017-05-01 DIAGNOSIS — Z0000 Encounter for general adult medical examination without abnormal findings: Secondary | ICD-10-CM | POA: Diagnosis not present

## 2017-05-01 DIAGNOSIS — Z720 Tobacco use: Secondary | ICD-10-CM

## 2017-05-01 DIAGNOSIS — Z833 Family history of diabetes mellitus: Secondary | ICD-10-CM

## 2017-05-01 MED ORDER — NORGESTIM-ETH ESTRAD TRIPHASIC 0.18/0.215/0.25 MG-25 MCG PO TABS: 1.0000 | ORAL_TABLET | Freq: Every day | ORAL | 11 refills | Status: DC

## 2017-05-01 NOTE — Patient Instructions (Signed)
Heart-Healthy Eating Plan Many factors influence your heart health, including eating and exercise habits. Heart (coronary) risk increases with abnormal blood fat (lipid) levels. Heart-healthy meal planning includes limiting unhealthy fats, increasing healthy fats, and making other small dietary changes. This includes maintaining a healthy body weight to help keep lipid levels within a normal range. What is my plan? Your health care provider recommends that you:  Get no more than ___25___% of the total calories in your daily diet from fat.  Limit your intake of saturated fat to less than ___5____% of your total calories each day.  Limit the amount of cholesterol in your diet to less than __300___ mg per day.  What types of fat should I choose?  Choose healthy fats more often. Choose monounsaturated and polyunsaturated fats, such as olive oil and canola oil, flaxseeds, walnuts, almonds, and seeds.  Eat more omega-3 fats. Good choices include salmon, mackerel, sardines, tuna, flaxseed oil, and ground flaxseeds. Aim to eat fish at least two times each week.  Limit saturated fats. Saturated fats are primarily found in animal products, such as meats, butter, and cream. Plant sources of saturated fats include palm oil, palm kernel oil, and coconut oil.  Avoid foods with partially hydrogenated oils in them. These contain trans fats. Examples of foods that contain trans fats are stick margarine, some tub margarines, cookies, crackers, and other baked goods. What general guidelines do I need to follow?  Check food labels carefully to identify foods with trans fats or high amounts of saturated fat.  Fill one half of your plate with vegetables and green salads. Eat 4-5 servings of vegetables per day. A serving of vegetables equals 1 cup of raw leafy vegetables,  cup of raw or cooked cut-up vegetables, or  cup of vegetable juice.  Fill one fourth of your plate with whole grains. Look for the word  "whole" as the first word in the ingredient list.  Fill one fourth of your plate with lean protein foods.  Eat 4-5 servings of fruit per day. A serving of fruit equals one medium whole fruit,  cup of dried fruit,  cup of fresh, frozen, or canned fruit, or  cup of 100% fruit juice.  Eat more foods that contain soluble fiber. Examples of foods that contain this type of fiber are apples, broccoli, carrots, beans, peas, and barley. Aim to get 20-30 g of fiber per day.  Eat more home-cooked food and less restaurant, buffet, and fast food.  Limit or avoid alcohol.  Limit foods that are high in starch and sugar.  Avoid fried foods.  Cook foods by using methods other than frying. Baking, boiling, grilling, and broiling are all great options. Other fat-reducing suggestions include: ? Removing the skin from poultry. ? Removing all visible fats from meats. ? Skimming the fat off of stews, soups, and gravies before serving them. ? Steaming vegetables in water or broth.  Lose weight if you are overweight. Losing just 5-10% of your initial body weight can help your overall health and prevent diseases such as diabetes and heart disease.  Increase your consumption of nuts, legumes, and seeds to 4-5 servings per week. One serving of dried beans or legumes equals  cup after being cooked, one serving of nuts equals 1 ounces, and one serving of seeds equals  ounce or 1 tablespoon.  You may need to monitor your salt (sodium) intake, especially if you have high blood pressure. Talk with your health care provider or dietitian to get  more information about reducing sodium. What foods can I eat? Grains  Breads, including French, white, pita, wheat, raisin, rye, oatmeal, and Italian. Tortillas that are neither fried nor made with lard or trans fat. Low-fat rolls, including hotdog and hamburger buns and English muffins. Biscuits. Muffins. Waffles. Pancakes. Light popcorn. Whole-grain cereals. Flatbread.  Melba toast. Pretzels. Breadsticks. Rusks. Low-fat snacks and crackers, including oyster, saltine, matzo, graham, animal, and rye. Rice and pasta, including brown rice and those that are made with whole wheat. Vegetables All vegetables. Fruits All fruits, but limit coconut. Meats and Other Protein Sources Lean, well-trimmed beef, veal, pork, and lamb. Chicken and turkey without skin. All fish and shellfish. Wild duck, rabbit, pheasant, and venison. Egg whites or low-cholesterol egg substitutes. Dried beans, peas, lentils, and tofu.Seeds and most nuts. Dairy Low-fat or nonfat cheeses, including ricotta, string, and mozzarella. Skim or 1% milk that is liquid, powdered, or evaporated. Buttermilk that is made with low-fat milk. Nonfat or low-fat yogurt. Beverages Mineral water. Diet carbonated beverages. Sweets and Desserts Sherbets and fruit ices. Honey, jam, marmalade, jelly, and syrups. Meringues and gelatins. Pure sugar candy, such as hard candy, jelly beans, gumdrops, mints, marshmallows, and small amounts of dark chocolate. Angel food cake. Eat all sweets and desserts in moderation. Fats and Oils Nonhydrogenated (trans-free) margarines. Vegetable oils, including soybean, sesame, sunflower, olive, peanut, safflower, corn, canola, and cottonseed. Salad dressings or mayonnaise that are made with a vegetable oil. Limit added fats and oils that you use for cooking, baking, salads, and as spreads. Other Cocoa powder. Coffee and tea. All seasonings and condiments. The items listed above may not be a complete list of recommended foods or beverages. Contact your dietitian for more options. What foods are not recommended? Grains Breads that are made with saturated or trans fats, oils, or whole milk. Croissants. Butter rolls. Cheese breads. Sweet rolls. Donuts. Buttered popcorn. Chow mein noodles. High-fat crackers, such as cheese or butter crackers. Meats and Other Protein Sources Fatty meats, such  as hotdogs, short ribs, sausage, spareribs, bacon, ribeye roast or steak, and mutton. High-fat deli meats, such as salami and bologna. Caviar. Domestic duck and goose. Organ meats, such as kidney, liver, sweetbreads, brains, gizzard, chitterlings, and heart. Dairy Cream, sour cream, cream cheese, and creamed cottage cheese. Whole milk cheeses, including blue (bleu), Monterey Jack, Brie, [place], American, Havarti, Swiss, cheddar, Camembert, and [place]. Whole or 2% milk that is liquid, evaporated, or condensed. Whole buttermilk. Cream sauce or high-fat cheese sauce. Yogurt that is made from whole milk. Beverages Regular sodas and drinks with added sugar. Sweets and Desserts Frosting. Pudding. Cookies. Cakes other than angel food cake. Candy that has milk chocolate or white chocolate, hydrogenated fat, butter, coconut, or unknown ingredients. Buttered syrups. Full-fat ice cream or ice cream drinks. Fats and Oils Gravy that has suet, meat fat, or shortening. Cocoa butter, hydrogenated oils, palm oil, coconut oil, palm kernel oil. These can often be found in baked products, candy, fried foods, nondairy creamers, and whipped toppings. Solid fats and shortenings, including bacon fat, salt pork, lard, and butter. Nondairy cream substitutes, such as coffee creamers and sour cream substitutes. Salad dressings that are made of unknown oils, cheese, or sour cream. The items listed above may not be a complete list of foods and beverages to avoid. Contact your dietitian for more information. This information is not intended to replace advice given to you by your health care provider. Make sure you discuss any questions you have with your health care   provider. Document Released: 01/18/2008 Document Revised: 10/29/2015 Document Reviewed: 10/02/2013 Elsevier Interactive Patient Education  2018 [employer].   Tobacco Use Disorder Tobacco use disorder (TUD) is a mental disorder. It is the long-term use of tobacco in  spite of related health problems or difficulty with normal life activities. Tobacco is most commonly smoked as cigarettes and less commonly as cigars or pipes. Smokeless chewing tobacco and snuff are also popular. People with TUD get a feeling of extreme pleasure (euphoria) from using tobacco and have a desire to use it again and again. Repeated use of tobacco can cause problems. The addictive effects of tobacco are due mainly tothe ingredient nicotine. Nicotine also causes a rush of adrenaline (epinephrine) in the body. This leads to increased blood pressure, heart rate, and breathing rate. These changes may cause problems for people with high blood pressure, weak hearts, or lung disease. High doses of nicotine in children and pets can lead to seizures and death. Tobacco contains a number of other unsafe chemicals. These chemicals are especially harmful when inhaled as smoke and can damage almost every organ in the body. Smokers live shorter lives than nonsmokers and are at risk of dying from a number of diseases and cancers. Tobacco smoke can also cause health problems for nonsmokers (due to inhaling secondhand smoke). Smoking is also a fire hazard. TUD usually starts in the late teenage years and is most common in young adults between the ages of 64 and 25 years. People who start smoking earlier in life are more likely to continue smoking as adults. TUD is somewhat more common in men than women. People with TUD are at higher risk for using alcohol and other drugs of abuse. What increases the risk? Risk factors for TUD include:  Having family members with the disorder.  Being around people who use tobacco.  Having an existing mental health issue such as schizophrenia, depression, bipolar disorder, ADHD, or posttraumatic stress disorder (PTSD).  What are the signs or symptoms? People with tobacco use disorder have two or more of the following signs and symptoms within 12 months:  Use of more  tobacco over a longer period than intended.  Not able to cut down or control tobacco use.  A lot of time spent obtaining or using tobacco.  Strong desire or urge to use tobacco (craving). Cravings may last for 6 months or longer after quitting.  Use of tobacco even when use leads to major problems at work, school, or home.  Use of tobacco even when use leads to relationship problems.  Giving up or cutting down on important life activities because of tobacco use.  Repeatedly using tobacco in situations where it puts you or others in physical danger, like smoking in bed.  Use of tobacco even when it is known that a physical or mental problem is likely related to tobacco use. ? Physical problems are numerous and may include chronic bronchitis, emphysema, lung and other cancers, gum disease, high blood pressure, heart disease, and stroke. ? Mental problems caused by tobacco may include difficulty sleeping and anxiety.  Need to use greater amounts of tobacco to get the same effect. This means you have developed a tolerance.  Withdrawal symptoms as a result of stopping or rapidly cutting back use. These symptoms may last a month or more after quitting and include the following: ? Depressed, anxious, or irritable mood. ? Difficulty concentrating. ? Increased appetite. ? Restlessness or trouble sleeping. ? Use of tobacco to avoid withdrawal symptoms.  How is this diagnosed? Tobacco use disorder is diagnosed by your health care provider. A diagnosis may be made by:  Your health care provider asking questions about your tobacco use and any problems it may be causing.  A physical exam.  Lab tests.  You may be referred to a mental health professional or addiction specialist.  The severity of tobacco use disorder depends on the number of signs and symptoms you have:  Mild-Two or three symptoms.  Moderate-Four or five symptoms.  Severe-Six or more symptoms.  How is this  treated? Many people with tobacco use disorder are unable to quit on their own and need help. Treatment options include the following:  Nicotine replacement therapy (NRT). NRT provides nicotine without the other harmful chemicals in tobacco. NRT gradually lowers the dosage of nicotine in the body and reduces withdrawal symptoms. NRT is available in over-the-counter forms (gum, lozenges, and skin patches) as well as prescription forms (mouth inhaler and nasal spray).  Medicines.This may include: ? Antidepressant medicine that may reduce nicotine cravings. ? A medicine that acts on nicotine receptors in the brain to reduce cravings and withdrawal symptoms. It may also block the effects of tobacco in people with TUD who relapse.  Counseling or talk therapy. A form of talk therapy called behavioral therapy is commonly used to treat people with TUD. Behavioral therapy looks at triggers for tobacco use, how to avoid them, and how to cope with cravings. It is most effective in person or by phone but is also available in self-help forms (books and Internet websites).  Support groups. These provide emotional support, advice, and guidance for quitting tobacco.  The most effective treatment for TUD is usually a combination of medicine, talk therapy, and support groups. Follow these instructions at home:  Keep all follow-up visits as directed by your health care provider. This is important.  Take medicines only as directed by your health care provider.  Check with your health care provider before starting new prescription or over-the-counter medicines. Contact a health care provider if:  You are not able to take your medicines as prescribed.  Treatment is not helping your TUD and your symptoms get worse. Get help right away if:  You have serious thoughts about hurting yourself or others.  You have trouble breathing, chest pain, sudden weakness, or sudden numbness in part of your body. This  information is not intended to replace advice given to you by your health care provider. Make sure you discuss any questions you have with your health care provider. Document Released: 12/15/2003 Document Revised: 12/12/2015  Document Reviewed: 06/06/2013 Elsevier Interactive Patient Education  2018 [employer]   Increase water intake, strive for at least 70 ounces/day.   Follow Heart Healthy diet Increase regular exercise.  Recommend at least 30 minutes daily, 5 days per week of walking, jogging, biking, swimming, YouTube/Pinterest workout videos. YOU CAN QUIT SMOKING!!!! We will call when your lab results are available. Please schedule complete physical this summer. NICE TO SEE YOU!

## 2017-05-01 NOTE — Assessment & Plan Note (Signed)
Birth Control Refilled for one yr Increase water intake, strive for at least 70 ounces/day.   Follow Heart Healthy diet Increase regular exercise.  Recommend at least 30 minutes daily, 5 days per week of walking, jogging, biking, swimming, YouTube/Pinterest workout videos. YOU CAN QUIT SMOKING!!!! We will call when your lab results are available. Please schedule complete physical this summer.

## 2017-05-01 NOTE — Assessment & Plan Note (Signed)
Has quit date of Spring 2019 If unable to quit by self, she will reach out to discuss assistive rx's

## 2017-05-01 NOTE — Assessment & Plan Note (Signed)
Vit d level drawn today

## 2017-05-02 LAB — HEMOGLOBIN A1C
Est. average glucose Bld gHb Est-mCnc: 97 mg/dL
HEMOGLOBIN A1C: 5 % (ref 4.8–5.6)

## 2017-05-02 LAB — CBC WITH DIFFERENTIAL/PLATELET
BASOS ABS: 0 10*3/uL (ref 0.0–0.2)
Basos: 0 %
EOS (ABSOLUTE): 0.2 10*3/uL (ref 0.0–0.4)
EOS: 3 %
HEMATOCRIT: 40.8 % (ref 34.0–46.6)
Hemoglobin: 13.3 g/dL (ref 11.1–15.9)
IMMATURE GRANS (ABS): 0 10*3/uL (ref 0.0–0.1)
IMMATURE GRANULOCYTES: 0 %
LYMPHS ABS: 1.6 10*3/uL (ref 0.7–3.1)
LYMPHS: 30 %
MCH: 28.2 pg (ref 26.6–33.0)
MCHC: 32.6 g/dL (ref 31.5–35.7)
MCV: 86 fL (ref 79–97)
MONOCYTES: 5 %
Monocytes Absolute: 0.3 10*3/uL (ref 0.1–0.9)
Neutrophils Absolute: 3.3 10*3/uL (ref 1.4–7.0)
Neutrophils: 62 %
Platelets: 236 10*3/uL (ref 150–379)
RBC: 4.72 x10E6/uL (ref 3.77–5.28)
RDW: 13.2 % (ref 12.3–15.4)
WBC: 5.3 10*3/uL (ref 3.4–10.8)

## 2017-05-02 LAB — LIPID PANEL
CHOLESTEROL TOTAL: 154 mg/dL (ref 100–199)
Chol/HDL Ratio: 2.8 ratio (ref 0.0–4.4)
HDL: 55 mg/dL (ref >39)
LDL Calculated: 88 mg/dL (ref 0–99)
TRIGLYCERIDES: 54 mg/dL (ref 0–149)
VLDL CHOLESTEROL CAL: 11 mg/dL (ref 5–40)

## 2017-05-02 LAB — COMPREHENSIVE METABOLIC PANEL
ALK PHOS: 40 IU/L (ref 39–117)
ALT: 6 IU/L (ref 0–32)
AST: 12 IU/L (ref 0–40)
Albumin/Globulin Ratio: 1.7 (ref 1.2–2.2)
Albumin: 4 g/dL (ref 3.5–5.5)
BUN/Creatinine Ratio: 16 (ref 9–23)
BUN: 10 mg/dL (ref 6–20)
Bilirubin Total: 0.3 mg/dL (ref 0.0–1.2)
CALCIUM: 9.4 mg/dL (ref 8.7–10.2)
CO2: 25 mmol/L (ref 20–29)
CREATININE: 0.64 mg/dL (ref 0.57–1.00)
Chloride: 105 mmol/L (ref 96–106)
GFR calc Af Amer: 145 mL/min/{1.73_m2} (ref >59)
GFR, EST NON AFRICAN AMERICAN: 126 mL/min/{1.73_m2} (ref >59)
GLOBULIN, TOTAL: 2.3 g/dL (ref 1.5–4.5)
GLUCOSE: 90 mg/dL (ref 65–99)
Potassium: 4.8 mmol/L (ref 3.5–5.2)
Sodium: 140 mmol/L (ref 134–144)
TOTAL PROTEIN: 6.3 g/dL (ref 6.0–8.5)

## 2017-05-02 LAB — TSH: TSH: 1.59 u[IU]/mL (ref 0.450–4.500)

## 2017-05-02 LAB — VITAMIN D 25 HYDROXY (VIT D DEFICIENCY, FRACTURES): Vit D, 25-Hydroxy: 30.7 ng/mL (ref 30.0–100.0)

## 2017-06-12 IMAGING — CT CT ABD-PELV W/ CM
2 of 4 series · 16 of 46 positions shown, 18 images · IV contrast (agent unspecified)
Comparison: None.

CLINICAL DATA: Left upper quadrant abdominal pain

EXAM:
CT ABDOMEN AND PELVIS WITH CONTRAST
TECHNIQUE: Multidetector CT imaging of the abdomen and pelvis was performed
using the standard protocol following bolus administration of
intravenous contrast.
CONTRAST:  100 cc [id number]

[Series 2: abd/pel w · axial · 0.70mm/px · z∈[+961,+1371]mm · 13 of 90 slices shown, 15 images]
[im 4/90  soft-tissue]
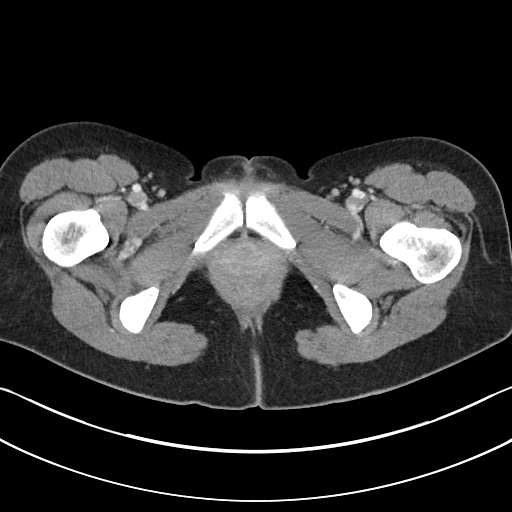
[im 4/90  bone]
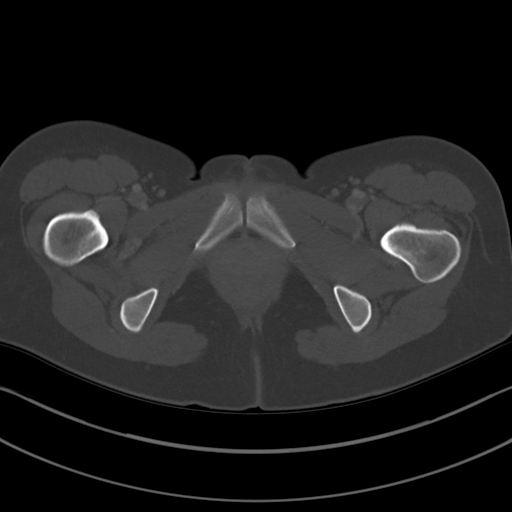
[im 11/90  soft-tissue]
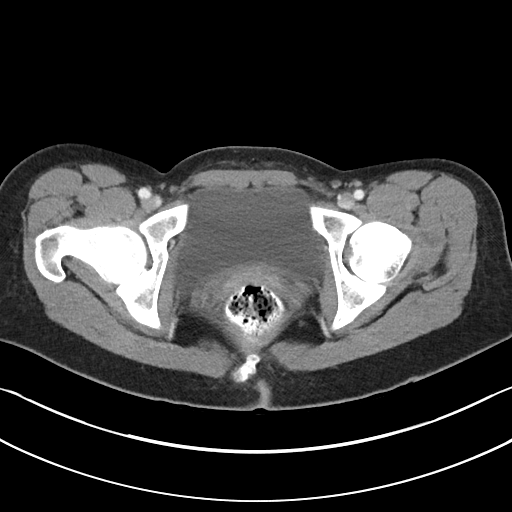
[im 18/90  soft-tissue]
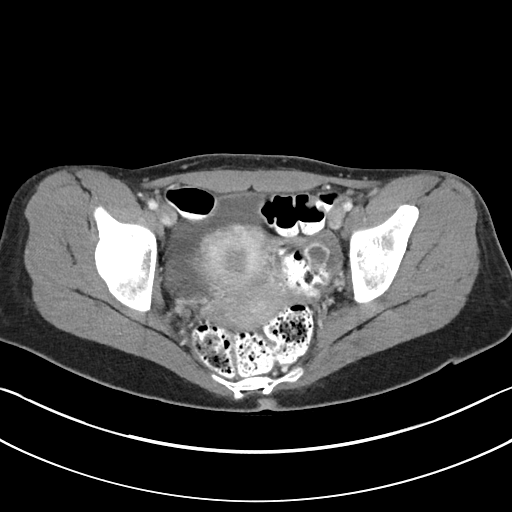
[im 25/90  soft-tissue]
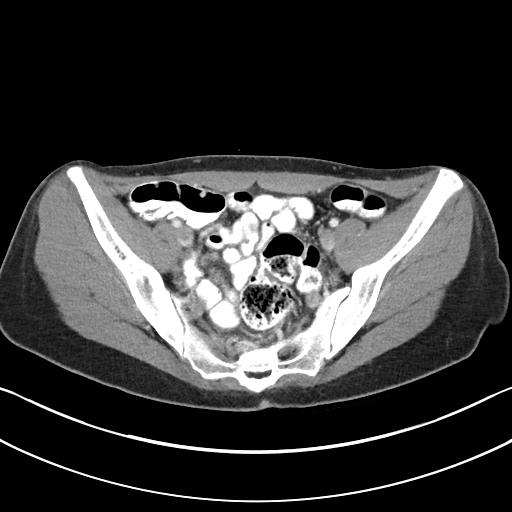
[im 33/90  soft-tissue]
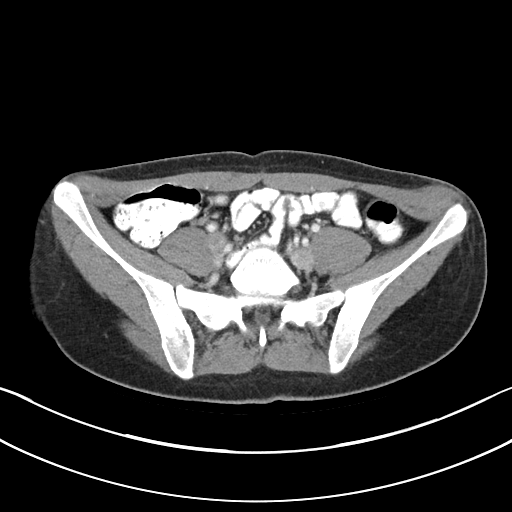
[im 40/90  soft-tissue]
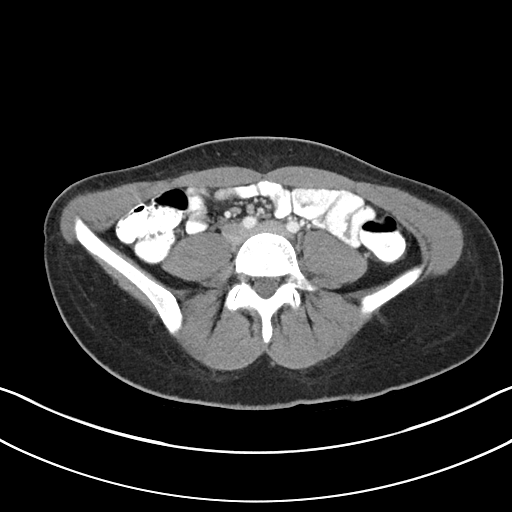
[im 47/90  soft-tissue]
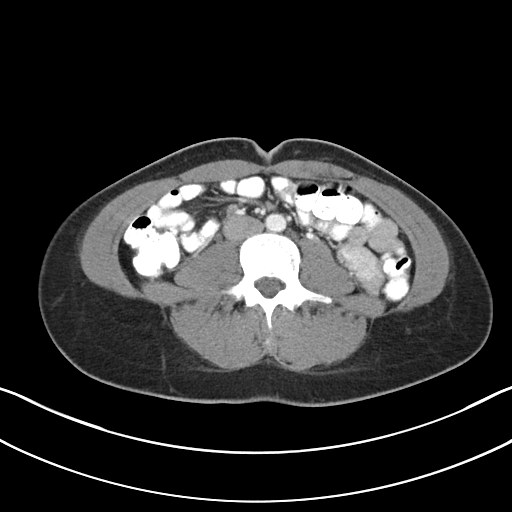
[im 50/90  soft-tissue]
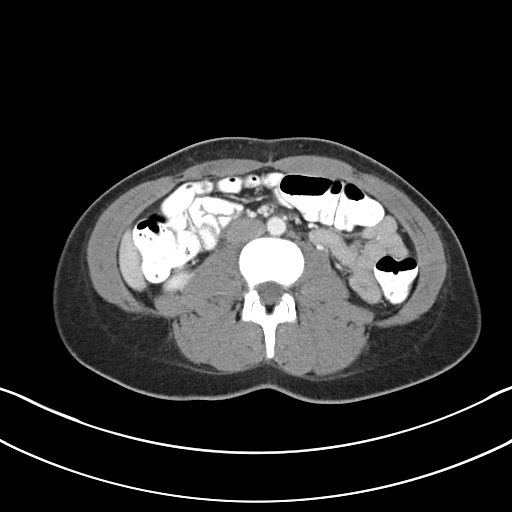
[im 57/90  soft-tissue]
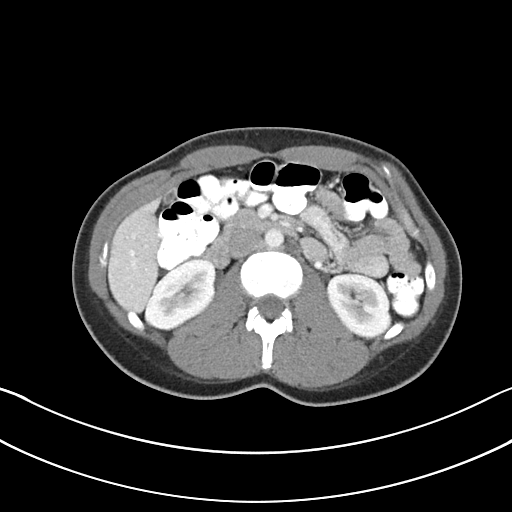
[im 57/90  bone]
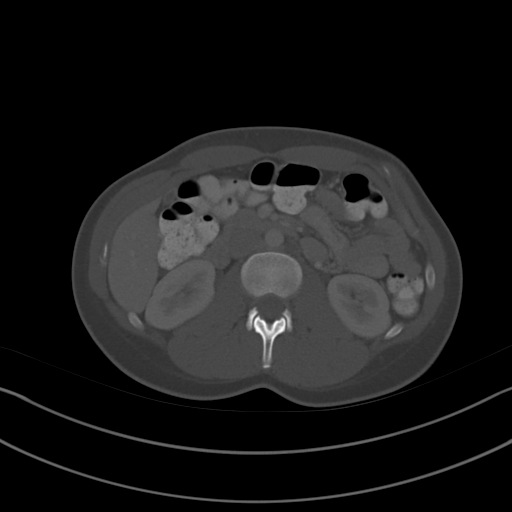
[im 65/90  soft-tissue]
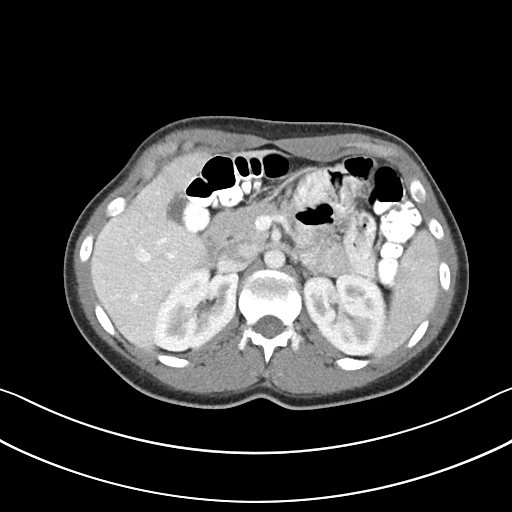
[im 72/90  soft-tissue]
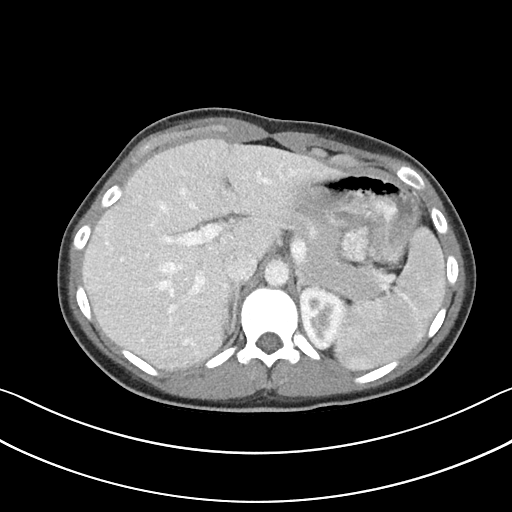
[im 79/90  soft-tissue]
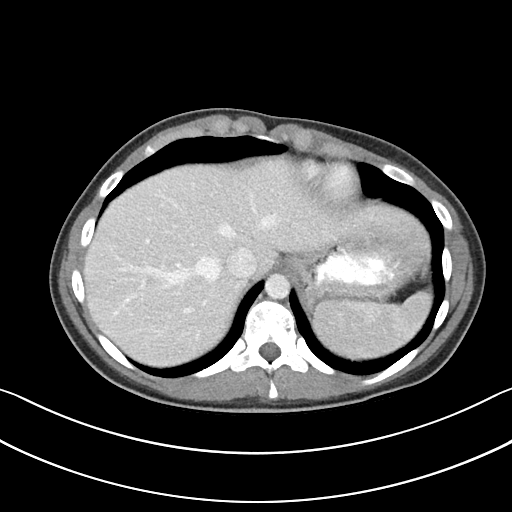
[im 86/90  soft-tissue]
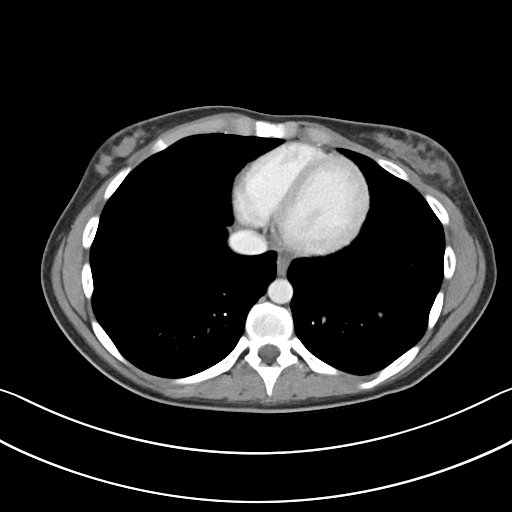

[Series 5: abd/pel w st · coronal · 0.74mm/px · 3 of 72 slices shown]
[im 24/72  soft-tissue]
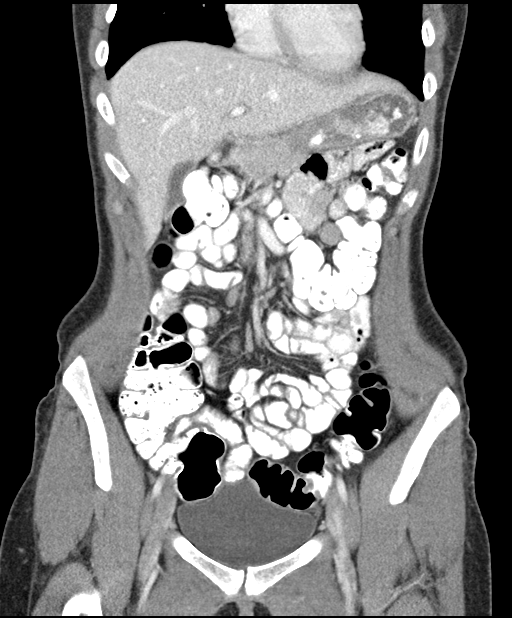
[im 32/72  soft-tissue]
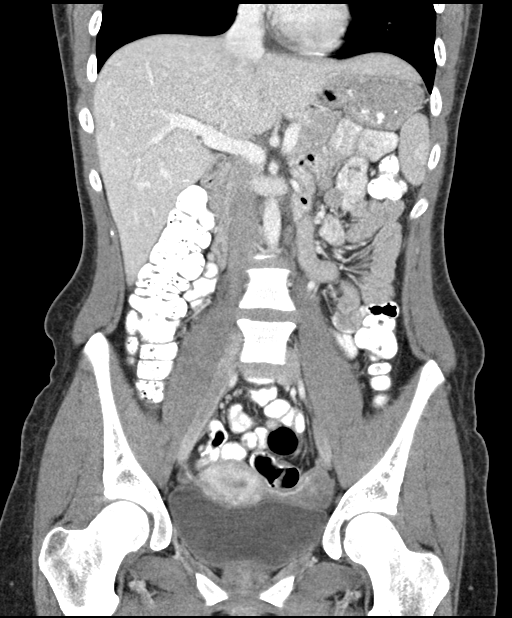
[im 40/72  soft-tissue]
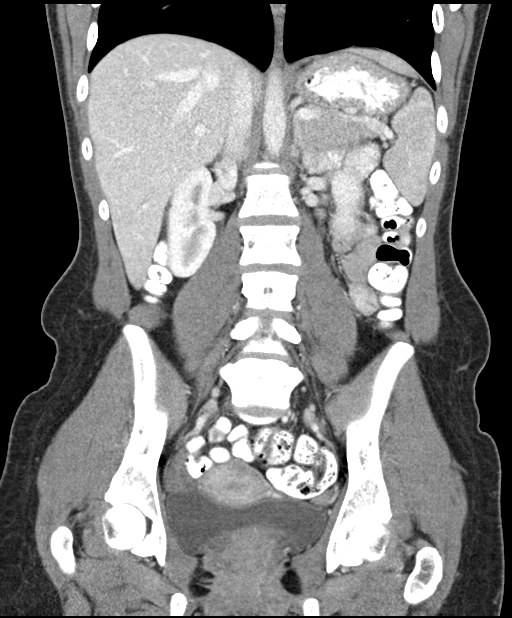

[16 of 46 positions shown; findings below may reference images not displayed]

FINDINGS: Lower chest: No acute abnormality.

Hepatobiliary: No focal liver abnormality is seen. No gallstones,
gallbladder wall thickening, or biliary dilatation.

Pancreas: Unremarkable. No pancreatic ductal dilatation or
surrounding inflammatory changes.

Spleen: Normal in size without focal abnormality.

Adrenals/Urinary Tract: Adrenal glands are unremarkable. Kidneys are
normal, without renal calculi, focal lesion, or hydronephrosis.
Bladder is unremarkable.

Stomach/Bowel: Physiologically distended stomach with oral contrast.
Normal small bowel rotation. No bowel obstruction or acute
inflammation. Contrast reaches the rectum. Moderate colonic stool
burden is noted. No mural thickening nor annular constricting
lesions are identified. Normal-appearing appendix.

Vascular/Lymphatic: No significant vascular findings are present. No
enlarged abdominal or pelvic lymph nodes.

Reproductive: Enhancing left ovarian hemorrhagic cyst or corpus
luteum measuring 1.7 cm.

Other: No abdominal wall hernia or abnormality. No abdominopelvic
ascites.

Musculoskeletal: No acute or significant osseous findings.
IMPRESSION: Hemorrhagic left ovarian cyst or corpus luteum measuring 1.7 cm.
Otherwise unremarkable CT of the abdomen and pelvis.

## 2017-10-30 ENCOUNTER — Encounter: Payer: Self-pay | Admitting: Adult Health

## 2017-10-30 ENCOUNTER — Ambulatory Visit (INDEPENDENT_AMBULATORY_CARE_PROVIDER_SITE_OTHER): Payer: 59 | Admitting: Adult Health

## 2017-10-30 VITALS — BP 101/65 | HR 82 | Ht 65.75 in | Wt 141.2 lb

## 2017-10-30 DIAGNOSIS — Z118 Encounter for screening for other infectious and parasitic diseases: Secondary | ICD-10-CM

## 2017-10-30 DIAGNOSIS — Z3041 Encounter for surveillance of contraceptive pills: Secondary | ICD-10-CM

## 2017-10-30 DIAGNOSIS — Z0000 Encounter for general adult medical examination without abnormal findings: Secondary | ICD-10-CM

## 2017-10-30 MED ORDER — NORETHINDRONE 0.35 MG PO TABS
1.0000 | ORAL_TABLET | Freq: Every day | ORAL | 11 refills | Status: DC
Start: 2017-10-30 — End: 2018-12-16

## 2017-10-30 NOTE — Patient Instructions (Signed)
Preventive Care for Adults, Female  A healthy lifestyle and preventive care can promote health and wellness. Preventive health guidelines for women include the following key practices.   A routine yearly physical is a good way to check with your health care provider about your health and preventive screening. It is a chance to share any concerns and updates on your health and to receive a thorough exam.   Visit your dentist for a routine exam and preventive care every 6 months. Brush your teeth twice a day and floss once a day. Good oral hygiene prevents tooth decay and gum disease.   The frequency of eye exams is based on your age, health, family medical history, use of contact lenses, and other factors. Follow your health care provider's recommendations for frequency of eye exams.   Eat a healthy diet. Foods like vegetables, fruits, whole grains, low-fat dairy products, and lean protein foods contain the nutrients you need without too many calories. Decrease your intake of foods high in solid fats, added sugars, and salt. Eat the right amount of calories for you.Get information about a proper diet from your health care provider, if necessary.   Regular physical exercise is one of the most important things you can do for your health. Most adults should get at least 150 minutes of moderate-intensity exercise (any activity that increases your heart rate and causes you to sweat) each week. In addition, most adults need muscle-strengthening exercises on 2 or more days a week.   Maintain a healthy weight. The body mass index (BMI) is a screening tool to identify possible weight problems. It provides an estimate of body fat based on height and weight. Your health care provider can find your BMI, and can help you achieve or maintain a healthy weight.For adults 20 years and older:   - A BMI below 18.5 is considered underweight.   - A BMI of 18.5 to 24.9 is normal.   - A BMI of 25 to 29.9 is  considered overweight.   - A BMI of 30 and above is considered obese.   Maintain normal blood lipids and cholesterol levels by exercising and minimizing your intake of trans and saturated fats.  Eat a balanced diet with plenty of fruit and vegetables. Blood tests for lipids and cholesterol should begin at age 20 and be repeated every 5 years minimum.  If your lipid or cholesterol levels are high, you are over 40, or you are at high risk for heart disease, you may need your cholesterol levels checked more frequently.Ongoing high lipid and cholesterol levels should be treated with medicines if diet and exercise are not working.   If you smoke, find out from your health care provider how to quit. If you do not use tobacco, do not start.   Lung cancer screening is recommended for adults aged 55-80 years who are at high risk for developing lung cancer because of a history of smoking. A yearly low-dose CT scan of the lungs is recommended for people who have at least a 30-pack-year history of smoking and are a current smoker or have quit within the past 15 years. A pack year of smoking is smoking an average of 1 pack of cigarettes a day for 1 year (for example: 1 pack a day for 30 years or 2 packs a day for 15 years). Yearly screening should continue until the smoker has stopped smoking for at least 15 years. Yearly screening should be stopped for people who develop a   health problem that would prevent them from having lung cancer treatment.   If you are pregnant, do not drink alcohol. If you are breastfeeding, be very cautious about drinking alcohol. If you are not pregnant and choose to drink alcohol, do not have more than 1 drink per day. One drink is considered to be 12 ounces (355 mL) of beer, 5 ounces (148 mL) of wine, or 1.5 ounces (44 mL) of liquor.   Avoid use of street drugs. Do not share needles with anyone. Ask for help if you need support or instructions about stopping the use of  drugs.   High blood pressure causes heart disease and increases the risk of stroke. Your blood pressure should be checked at least yearly.  Ongoing high blood pressure should be treated with medicines if weight loss and exercise do not work.   If you are 69-55 years old, ask your health care provider if you should take aspirin to prevent strokes.   Diabetes screening involves taking a blood sample to check your fasting blood sugar level. This should be done once every 3 years, after age 38, if you are within normal weight and without risk factors for diabetes. Testing should be considered at a younger age or be carried out more frequently if you are overweight and have at least 1 risk factor for diabetes.   Breast cancer screening is essential preventive care for women. You should practice "breast self-awareness."  This means understanding the normal appearance and feel of your breasts and may include breast self-examination.  Any changes detected, no matter how small, should be reported to a health care provider.  Women in their 80s and 30s should have a clinical breast exam (CBE) by a health care provider as part of a regular health exam every 1 to 3 years.  After age 66, women should have a CBE every year.  Starting at age 1, women should consider having a mammogram (breast X-ray test) every year.  Women who have a family history of breast cancer should talk to their health care provider about genetic screening.  Women at a high risk of breast cancer should talk to their health care providers about having an MRI and a mammogram every year.   -Breast cancer gene (BRCA)-related cancer risk assessment is recommended for women who have family members with BRCA-related cancers. BRCA-related cancers include breast, ovarian, tubal, and peritoneal cancers. Having family members with these cancers may be associated with an increased risk for harmful changes (mutations) in the breast cancer genes BRCA1 and  BRCA2. Results of the assessment will determine the need for genetic counseling and BRCA1 and BRCA2 testing.   The Pap test is a screening test for cervical cancer. A Pap test can show cell changes on the cervix that might become cervical cancer if left untreated. A Pap test is a procedure in which cells are obtained and examined from the lower end of the uterus (cervix).   - Women should have a Pap test starting at age 57.   - Between ages 90 and 70, Pap tests should be repeated every 2 years.   - Beginning at age 63, you should have a Pap test every 3 years as long as the past 3 Pap tests have been normal.   - Some women have medical problems that increase the chance of getting cervical cancer. Talk to your health care provider about these problems. It is especially important to talk to your health care provider if a  new problem develops soon after your last Pap test. In these cases, your health care provider may recommend more frequent screening and Pap tests.   - The above recommendations are the same for women who have or have not gotten the vaccine for human papillomavirus (HPV).   - If you had a hysterectomy for a problem that was not cancer or a condition that could lead to cancer, then you no longer need Pap tests. Even if you no longer need a Pap test, a regular exam is a good idea to make sure no other problems are starting.   - If you are between ages 36 and 66 years, and you have had normal Pap tests going back 10 years, you no longer need Pap tests. Even if you no longer need a Pap test, a regular exam is a good idea to make sure no other problems are starting.   - If you have had past treatment for cervical cancer or a condition that could lead to cancer, you need Pap tests and screening for cancer for at least 20 years after your treatment.   - If Pap tests have been discontinued, risk factors (such as a new sexual partner) need to be reassessed to determine if screening should  be resumed.   - The HPV test is an additional test that may be used for cervical cancer screening. The HPV test looks for the virus that can cause the cell changes on the cervix. The cells collected during the Pap test can be tested for HPV. The HPV test could be used to screen women aged 70 years and older, and should be used in women of any age who have unclear Pap test results. After the age of 67, women should have HPV testing at the same frequency as a Pap test.   Colorectal cancer can be detected and often prevented. Most routine colorectal cancer screening begins at the age of 57 years and continues through age 26 years. However, your health care provider may recommend screening at an earlier age if you have risk factors for colon cancer. On a yearly basis, your health care provider may provide home test kits to check for hidden blood in the stool.  Use of a small camera at the end of a tube, to directly examine the colon (sigmoidoscopy or colonoscopy), can detect the earliest forms of colorectal cancer. Talk to your health care provider about this at age 23, when routine screening begins. Direct exam of the colon should be repeated every 5 -10 years through age 49 years, unless early forms of pre-cancerous polyps or small growths are found.   People who are at an increased risk for hepatitis B should be screened for this virus. You are considered at high risk for hepatitis B if:  -You were born in a country where hepatitis B occurs often. Talk with your health care provider about which countries are considered high risk.  - Your parents were born in a high-risk country and you have not received a shot to protect against hepatitis B (hepatitis B vaccine).  - You have HIV or AIDS.  - You use needles to inject street drugs.  - You live with, or have sex with, someone who has Hepatitis B.  - You get hemodialysis treatment.  - You take certain medicines for conditions like cancer, organ  transplantation, and autoimmune conditions.   Hepatitis C blood testing is recommended for all people born from [birth year] through 1965 and any individual  with known risks for hepatitis C.   Practice safe sex. Use condoms and avoid high-risk sexual practices to reduce the spread of sexually transmitted infections (STIs). STIs include gonorrhea, chlamydia, syphilis, trichomonas, herpes, HPV, and human immunodeficiency virus (HIV). Herpes, HIV, and HPV are viral illnesses that have no cure. They can result in disability, cancer, and death. Sexually active women aged 25 years and younger should be checked for chlamydia. Older women with new or multiple partners should also be tested for chlamydia. Testing for other STIs is recommended if you are sexually active and at increased risk.   Osteoporosis is a disease in which the bones lose minerals and strength with aging. This can result in serious bone fractures or breaks. The risk of osteoporosis can be identified using a bone density scan. Women ages 65 years and over and women at risk for fractures or osteoporosis should discuss screening with their health care providers. Ask your health care provider whether you should take a calcium supplement or vitamin D to There are also several preventive steps women can take to avoid osteoporosis and resulting fractures or to keep osteoporosis from worsening. -->Recommendations include:  Eat a balanced diet high in fruits, vegetables, calcium, and vitamins.  Get enough calcium. The recommended total intake of is 1,200 mg daily; for best absorption, if taking supplements, divide doses into 250-500 mg doses throughout the day. Of the two types of calcium, calcium carbonate is best absorbed when taken with food but calcium citrate can be taken on an empty stomach.  Get enough vitamin D. NAMS and the National Osteoporosis Foundation recommend at least 1,000 IU per day for women age 50 and over who are at risk of vitamin D  deficiency. Vitamin D deficiency can be caused by inadequate sun exposure (for example, those who live in northern latitudes).  Avoid alcohol and smoking. Heavy alcohol intake (more than 7 drinks per week) increases the risk of falls and hip fracture and women smokers tend to lose bone more rapidly and have lower bone mass than nonsmokers. Stopping smoking is one of the most important changes women can make to improve their health and decrease risk for disease.  Be physically active every day. Weight-bearing exercise (for example, fast walking, hiking, jogging, and weight training) may strengthen bones or slow the rate of bone loss that comes with aging. Balancing and muscle-strengthening exercises can reduce the risk of falling and fracture.  Consider therapeutic medications. Currently, several types of effective drugs are available. Healthcare providers can recommend the type most appropriate for each woman.  Eliminate environmental factors that may contribute to accidents. Falls cause nearly 90% of all osteoporotic fractures, so reducing this risk is an important bone-health strategy. Measures include ample lighting, removing obstructions to walking, using nonskid rugs on floors, and placing mats and/or grab bars in showers.  Be aware of medication side effects. Some common medicines make bones weaker. These include a type of steroid drug called glucocorticoids used for arthritis and asthma, some antiseizure drugs, certain sleeping pills, treatments for endometriosis, and some cancer drugs. An overactive thyroid gland or using too much thyroid hormone for an underactive thyroid can also be a problem. If you are taking these medicines, talk to your doctor about what you can do to help protect your bones.reduce the rate of osteoporosis.    Menopause can be associated with physical symptoms and risks. Hormone replacement therapy is available to decrease symptoms and risks. You should talk to your  health care provider   about whether hormone replacement therapy is right for you.   Use sunscreen. Apply sunscreen liberally and repeatedly throughout the day. You should seek shade when your shadow is shorter than you. Protect yourself by wearing long sleeves, pants, a wide-brimmed hat, and sunglasses year round, whenever you are outdoors.   Once a month, do a whole body skin exam, using a mirror to look at the skin on your back. Tell your health care provider of new moles, moles that have irregular borders, moles that are larger than a pencil eraser, or moles that have changed in shape or color.   -Stay current with required vaccines (immunizations).   Influenza vaccine. All adults should be immunized every year.  Tetanus, diphtheria, and acellular pertussis (Td, Tdap) vaccine. Pregnant women should receive 1 dose of Tdap vaccine during each pregnancy. The dose should be obtained regardless of the length of time since the last dose. Immunization is preferred during the 27th 36th week of gestation. An adult who has not previously received Tdap or who does not know her vaccine status should receive 1 dose of Tdap. This initial dose should be followed by tetanus and diphtheria toxoids (Td) booster doses every 10 years. Adults with an unknown or incomplete history of completing a 3-dose immunization series with Td-containing vaccines should begin or complete a primary immunization series including a Tdap dose. Adults should receive a Td booster every 10 years.  Varicella vaccine. An adult without evidence of immunity to varicella should receive 2 doses or a second dose if she has previously received 1 dose. Pregnant females who do not have evidence of immunity should receive the first dose after pregnancy. This first dose should be obtained before leaving the health care facility. The second dose should be obtained 4 8 weeks after the first dose.  Human papillomavirus (HPV) vaccine. Females aged 13 26  years who have not received the vaccine previously should obtain the 3-dose series. The vaccine is not recommended for use in pregnant females. However, pregnancy testing is not needed before receiving a dose. If a female is found to be pregnant after receiving a dose, no treatment is needed. In that case, the remaining doses should be delayed until after the pregnancy. Immunization is recommended for any person with an immunocompromised condition through the age of 26 years if she did not get any or all doses earlier. During the 3-dose series, the second dose should be obtained 4 8 weeks after the first dose. The third dose should be obtained 24 weeks after the first dose and 16 weeks after the second dose.  Zoster vaccine. One dose is recommended for adults aged 60 years or older unless certain conditions are present.  Measles, mumps, and rubella (MMR) vaccine. Adults born before 1957 generally are considered immune to measles and mumps. Adults born in 1957 or later should have 1 or more doses of MMR vaccine unless there is a contraindication to the vaccine or there is laboratory evidence of immunity to each of the three diseases. A routine second dose of MMR vaccine should be obtained at least 28 days after the first dose for students attending postsecondary schools, health care workers, or international travelers. People who received inactivated measles vaccine or an unknown type of measles vaccine during 1963 1967 should receive 2 doses of MMR vaccine. People who received inactivated mumps vaccine or an unknown type of mumps vaccine before 1979 and are at high risk for mumps infection should consider immunization with 2 doses of   MMR vaccine. For females of childbearing age, rubella immunity should be determined. If there is no evidence of immunity, females who are not pregnant should be vaccinated. If there is no evidence of immunity, females who are pregnant should delay immunization until after pregnancy.  Unvaccinated health care workers born before [birth year] who lack laboratory evidence of measles, mumps, or rubella immunity or laboratory confirmation of disease should consider measles and mumps immunization with 2 doses of MMR vaccine or rubella immunization with 1 dose of MMR vaccine.  Pneumococcal 13-valent conjugate (PCV13) vaccine. When indicated, a person who is uncertain of her immunization history and has no record of immunization should receive the PCV13 vaccine. An adult aged 54 years or older who has certain medical conditions and has not been previously immunized should receive 1 dose of PCV13 vaccine. This PCV13 should be followed with a dose of pneumococcal polysaccharide (PPSV23) vaccine. The PPSV23 vaccine dose should be obtained at least 8 weeks after the dose of PCV13 vaccine. An adult aged 58 years or older who has certain medical conditions and previously received 1 or more doses of PPSV23 vaccine should receive 1 dose of PCV13. The PCV13 vaccine dose should be obtained 1 or more years after the last PPSV23 vaccine dose.  Pneumococcal polysaccharide (PPSV23) vaccine. When PCV13 is also indicated, PCV13 should be obtained first. All adults aged 58 years and older should be immunized. An adult younger than age 65 years who has certain medical conditions should be immunized. Any person who resides in a nursing home or long-term care facility should be immunized. An adult smoker should be immunized. People with an immunocompromised condition and certain other conditions should receive both PCV13 and PPSV23 vaccines. People with human immunodeficiency virus (HIV) infection should be immunized as soon as possible after diagnosis. Immunization during chemotherapy or radiation therapy should be avoided. Routine use of PPSV23 vaccine is not recommended for American Indians, [place] Natives, or people younger than 65 years unless there are medical conditions that require PPSV23 vaccine. When indicated,  people who have unknown immunization and have no record of immunization should receive PPSV23 vaccine. One-time revaccination 5 years after the first dose of PPSV23 is recommended for people aged 70 64 years who have chronic kidney failure, nephrotic syndrome, asplenia, or immunocompromised conditions. People who received 1 2 doses of PPSV23 before age 32 years should receive another dose of PPSV23 vaccine at age 96 years or later if at least 5 years have passed since the previous dose. Doses of PPSV23 are not needed for people immunized with PPSV23 at or after age 55 years.  Meningococcal vaccine. Adults with asplenia or persistent complement component deficiencies should receive 2 doses of quadrivalent meningococcal conjugate (MenACWY-D) vaccine. The doses should be obtained at least 2 months apart. Microbiologists working with certain meningococcal bacteria, [employer] recruits, people at risk during an outbreak, and people who travel to or live in countries with a high rate of meningitis should be immunized. A first-year college student up through age 58 years who is living in a residence hall should receive a dose if she did not receive a dose on or after her 16th birthday. Adults who have certain high-risk conditions should receive one or more doses of vaccine.  Hepatitis A vaccine. Adults who wish to be protected from this disease, have certain high-risk conditions, work with hepatitis A-infected animals, work in hepatitis A research labs, or travel to or work in countries with a high rate of hepatitis A should be  immunized. Adults who were previously unvaccinated and who anticipate close contact with an international adoptee during the first 60 days after arrival in the [place] from a country with a high rate of hepatitis A should be immunized.  Hepatitis B vaccine.  Adults who wish to be protected from this disease, have certain high-risk conditions, may be exposed to blood or other infectious  body fluids, are household contacts or sex partners of hepatitis B positive people, are clients or workers in certain care facilities, or travel to or work in countries with a high rate of hepatitis B should be immunized.  Haemophilus influenzae type b (Hib) vaccine. A previously unvaccinated person with asplenia or sickle cell disease or having a scheduled splenectomy should receive 1 dose of Hib vaccine. Regardless of previous immunization, a recipient of a hematopoietic stem cell transplant should receive a 3-dose series 6 12 months after her successful transplant. Hib vaccine is not recommended for adults with HIV infection.  Preventive Services / Frequency Ages 6 to 39years  Blood pressure check.** / Every 1 to 2 years.  Lipid and cholesterol check.** / Every 5 years beginning at age 39.  Clinical breast exam.** / Every 3 years for women in their 61s and 62s.  BRCA-related cancer risk assessment.** / For women who have family members with a BRCA-related cancer (breast, ovarian, tubal, or peritoneal cancers).  Pap test.** / Every 2 years from ages 47 through 85. Every 3 years starting at age 34 through age 12 or 74 with a history of 3 consecutive normal Pap tests.  HPV screening.** / Every 3 years from ages 46 through ages 43 to 54 with a history of 3 consecutive normal Pap tests.  Hepatitis C blood test.** / For any individual with known risks for hepatitis C.  Skin self-exam. / Monthly.  Influenza vaccine. / Every year.  Tetanus, diphtheria, and acellular pertussis (Tdap, Td) vaccine.** / Consult your health care provider. Pregnant women should receive 1 dose of Tdap vaccine during each pregnancy. 1 dose of Td every 10 years.  Varicella vaccine.** / Consult your health care provider. Pregnant females who do not have evidence of immunity should receive the first dose after pregnancy.  HPV vaccine. / 3 doses over 6 months, if 64 and younger. The vaccine is not recommended for use in  pregnant females. However, pregnancy testing is not needed before receiving a dose.  Measles, mumps, rubella (MMR) vaccine.** / You need at least 1 dose of MMR if you were born in 1957 or later. You may also need a 2nd dose. For females of childbearing age, rubella immunity should be determined. If there is no evidence of immunity, females who are not pregnant should be vaccinated. If there is no evidence of immunity, females who are pregnant should delay immunization until after pregnancy.  Pneumococcal 13-valent conjugate (PCV13) vaccine.** / Consult your health care provider.  Pneumococcal polysaccharide (PPSV23) vaccine.** / 1 to 2 doses if you smoke cigarettes or if you have certain conditions.  Meningococcal vaccine.** / 1 dose if you are age 71 to 35 years and a Market researcher living in a residence hall, or have one of several medical conditions, you need to get vaccinated against meningococcal disease. You may also need additional booster doses.  Hepatitis A vaccine.** / Consult your health care provider.  Hepatitis B vaccine.** / Consult your health care provider.  Haemophilus influenzae type b (Hib) vaccine.** / Consult your health care provider.  Ages 55 to 64years  Blood pressure check.** / Every 1 to 2 years.  Lipid and cholesterol check.** / Every 5 years beginning at age 20 years.  Lung cancer screening. / Every year if you are aged 55 80 years and have a 30-pack-year history of smoking and currently smoke or have quit within the past 15 years. Yearly screening is stopped once you have quit smoking for at least 15 years or develop a health problem that would prevent you from having lung cancer treatment.  Clinical breast exam.** / Every year after age 40 years.  BRCA-related cancer risk assessment.** / For women who have family members with a BRCA-related cancer (breast, ovarian, tubal, or peritoneal cancers).  Mammogram.** / Every year beginning at age 40  years and continuing for as long as you are in good health. Consult with your health care provider.  Pap test.** / Every 3 years starting at age 30 years through age 65 or 70 years with a history of 3 consecutive normal Pap tests.  HPV screening.** / Every 3 years from ages 30 years through ages 65 to 70 years with a history of 3 consecutive normal Pap tests.  Fecal occult blood test (FOBT) of stool. / Every year beginning at age 50 years and continuing until age 75 years. You may not need to do this test if you get a colonoscopy every 10 years.  Flexible sigmoidoscopy or colonoscopy.** / Every 5 years for a flexible sigmoidoscopy or every 10 years for a colonoscopy beginning at age 50 years and continuing until age 75 years.  Hepatitis C blood test.** / For all people born from 1945 through 1965 and any individual with known risks for hepatitis C.  Skin self-exam. / Monthly.  Influenza vaccine. / Every year.  Tetanus, diphtheria, and acellular pertussis (Tdap/Td) vaccine.** / Consult your health care provider. Pregnant women should receive 1 dose of Tdap vaccine during each pregnancy. 1 dose of Td every 10 years.  Varicella vaccine.** / Consult your health care provider. Pregnant females who do not have evidence of immunity should receive the first dose after pregnancy.  Zoster vaccine.** / 1 dose for adults aged 60 years or older.  Measles, mumps, rubella (MMR) vaccine.** / You need at least 1 dose of MMR if you were born in 1957 or later. You may also need a 2nd dose. For females of childbearing age, rubella immunity should be determined. If there is no evidence of immunity, females who are not pregnant should be vaccinated. If there is no evidence of immunity, females who are pregnant should delay immunization until after pregnancy.  Pneumococcal 13-valent conjugate (PCV13) vaccine.** / Consult your health care provider.  Pneumococcal polysaccharide (PPSV23) vaccine.** / 1 to 2 doses if  you smoke cigarettes or if you have certain conditions.  Meningococcal vaccine.** / Consult your health care provider.  Hepatitis A vaccine.** / Consult your health care provider.  Hepatitis B vaccine.** / Consult your health care provider.  Haemophilus influenzae type b (Hib) vaccine.** / Consult your health care provider.  Ages 65 years and over  Blood pressure check.** / Every 1 to 2 years.  Lipid and cholesterol check.** / Every 5 years beginning at age 20 years.  Lung cancer screening. / Every year if you are aged 55 80 years and have a 30-pack-year history of smoking and currently smoke or have quit within the past 15 years. Yearly screening is stopped once you have quit smoking for at least 15 years or develop a health problem that   would prevent you from having lung cancer treatment.  Clinical breast exam.** / Every year after age 103 years.  BRCA-related cancer risk assessment.** / For women who have family members with a BRCA-related cancer (breast, ovarian, tubal, or peritoneal cancers).  Mammogram.** / Every year beginning at age 36 years and continuing for as long as you are in good health. Consult with your health care provider.  Pap test.** / Every 3 years starting at age 5 years through age 85 or 10 years with 3 consecutive normal Pap tests. Testing can be stopped between 65 and 70 years with 3 consecutive normal Pap tests and no abnormal Pap or HPV tests in the past 10 years.  HPV screening.** / Every 3 years from ages 93 years through ages 70 or 45 years with a history of 3 consecutive normal Pap tests. Testing can be stopped between 65 and 70 years with 3 consecutive normal Pap tests and no abnormal Pap or HPV tests in the past 10 years.  Fecal occult blood test (FOBT) of stool. / Every year beginning at age 8 years and continuing until age 45 years. You may not need to do this test if you get a colonoscopy every 10 years.  Flexible sigmoidoscopy or colonoscopy.** /  Every 5 years for a flexible sigmoidoscopy or every 10 years for a colonoscopy beginning at age 69 years and continuing until age 68 years.  Hepatitis C blood test.** / For all people born from [birth year] through 1967 and any individual with known risks for hepatitis C.  Osteoporosis screening.** / A one-time screening for women ages 7 years and over and women at risk for fractures or osteoporosis.  Skin self-exam. / Monthly.  Influenza vaccine. / Every year.  Tetanus, diphtheria, and acellular pertussis (Tdap/Td) vaccine.** / 1 dose of Td every 10 years.  Varicella vaccine.** / Consult your health care provider.  Zoster vaccine.** / 1 dose for adults aged 5 years or older.  Pneumococcal 13-valent conjugate (PCV13) vaccine.** / Consult your health care provider.  Pneumococcal polysaccharide (PPSV23) vaccine.** / 1 dose for all adults aged 74 years and older.  Meningococcal vaccine.** / Consult your health care provider.  Hepatitis A vaccine.** / Consult your health care provider.  Hepatitis B vaccine.** / Consult your health care provider.  Haemophilus influenzae type b (Hib) vaccine.** / Consult your health care provider. ** Family history and personal history of risk and conditions may change your health care provider's recommendations. Document Released: 06/06/2001 Document Revised: 01/29/2013  [facility] Patient Information 2014 [place], [place].   EXERCISE AND DIET:  We recommended that you start or continue a regular exercise program for good health. Regular exercise means any activity that makes your heart beat faster and makes you sweat.  We recommend exercising at least 30 minutes per day at least 3 days a week, preferably 5.  We also recommend a diet low in fat and sugar / carbohydrates.  Inactivity, poor dietary choices and obesity can cause diabetes, heart attack, stroke, and kidney damage, among others.     ALCOHOL AND SMOKING:  Women should limit their alcohol intake to no  more than 7 drinks/beers/glasses of wine (combined, not each!) per week. Moderation of alcohol intake to this level decreases your risk of breast cancer and liver damage.  ( And of course, no recreational drugs are part of a healthy lifestyle.)  Also, you should not be smoking at all or even being exposed to second hand smoke. Most people know smoking can  cause cancer, and various heart and lung diseases, but did you know it also contributes to weakening of your bones?  Aging of your skin?  Yellowing of your teeth and nails?   CALCIUM AND VITAMIN D:  Adequate intake of calcium and Vitamin D are recommended.  The recommendations for exact amounts of these supplements seem to change often, but generally speaking 600 mg of calcium (either carbonate or citrate) and 800 units of Vitamin D per day seems prudent. Certain women may benefit from higher intake of Vitamin D.  If you are among these women, your doctor will have told you during your visit.     PAP SMEARS:  Pap smears, to check for cervical cancer or precancers,  have traditionally been done yearly, although recent scientific advances have shown that most women can have pap smears less often.  However, every woman still should have a physical exam from her gynecologist or primary care physician every year. It will include a breast check, inspection of the vulva and vagina to check for abnormal growths or skin changes, a visual exam of the cervix, and then an exam to evaluate the size and shape of the uterus and ovaries.  And after 24 years of age, a rectal exam is indicated to check for rectal cancers. We will also provide age appropriate advice regarding health maintenance, like when you should have certain vaccines, screening for sexually transmitted diseases, bone density testing, colonoscopy, mammograms, etc.    MAMMOGRAMS:  All women over 65 years old should have a yearly mammogram. Many facilities now offer a "3D" mammogram, which may cost  around $50 extra out of pocket. If possible,  we recommend you accept the option to have the 3D mammogram performed.  It both reduces the number of women who will be called back for extra views which then turn out to be normal, and it is better than the routine mammogram at detecting truly abnormal areas.     COLONOSCOPY:  Colonoscopy to screen for colon cancer is recommended for all women at age 26.  We know, you hate the idea of the prep.  We agree, BUT, having colon cancer and not knowing it is worse!!  Colon cancer so often starts as a polyp that can be seen and removed at colonscopy, which can quite literally save your life!  And if your first colonoscopy is normal and you have no family history of colon cancer, most women don't have to have it again for 10 years.  Once every ten years, you can do something that may end up saving your life, right?  We will be happy to help you get it scheduled when you are ready.  Be sure to check your insurance coverage so you understand how much it will cost.  It may be covered as a preventative service at no cost, but you should check your particular policy.    Mediterranean Diet A Mediterranean diet refers to food and lifestyle choices that are based on the traditions of countries located on the [employer]. This way of eating has been shown to help prevent certain conditions and improve outcomes for people who have chronic diseases, like kidney disease and heart disease. What are tips for following this plan? Lifestyle  Cook and eat meals together with your family, when possible.  Drink enough fluid to keep your urine clear or pale yellow.  Be physically active every day. This includes: ? Aerobic exercise like running or swimming. ? Leisure activities like  gardening, walking, or housework.  Get 7-8 hours of sleep each night.  If recommended by your health care provider, drink red wine in moderation. This means 1 glass a day for nonpregnant women  and 2 glasses a day for men. A glass of wine equals 5 oz (150 mL). Reading food labels  Check the serving size of packaged foods. For foods such as rice and pasta, the serving size refers to the amount of cooked product, not dry.  Check the total fat in packaged foods. Avoid foods that have saturated fat or trans fats.  Check the ingredients list for added sugars, such as corn syrup. Shopping  At the grocery store, buy most of your food from the areas near the walls of the store. This includes: ? Fresh fruits and vegetables (produce). ? Grains, beans, nuts, and seeds. Some of these may be available in unpackaged forms or large amounts (in bulk). ? Fresh seafood. ? Poultry and eggs. ? Low-fat dairy products.  Buy whole ingredients instead of prepackaged foods.  Buy fresh fruits and vegetables in-season from local farmers markets.  Buy frozen fruits and vegetables in resealable bags.  If you do not have access to quality fresh seafood, buy precooked frozen shrimp or canned fish, such as tuna, salmon, or sardines.  Buy small amounts of raw or cooked vegetables, salads, or olives from the deli or salad bar at your store.  Stock your pantry so you always have certain foods on hand, such as olive oil, canned tuna, canned tomatoes, rice, pasta, and beans. Cooking  Cook foods with extra-virgin olive oil instead of using butter or other vegetable oils.  Have meat as a side dish, and have vegetables or grains as your main dish. This means having meat in small portions or adding small amounts of meat to foods like pasta or stew.  Use beans or vegetables instead of meat in common dishes like chili or lasagna.  Experiment with different cooking methods. Try roasting or broiling vegetables instead of steaming or sauteing them.  Add frozen vegetables to soups, stews, pasta, or rice.  Add nuts or seeds for added healthy fat at each meal. You can add these to yogurt, salads, or vegetable  dishes.  Marinate fish or vegetables using olive oil, lemon juice, garlic, and fresh herbs. Meal planning  Plan to eat 1 vegetarian meal one day each week. Try to work up to 2 vegetarian meals, if possible.  Eat seafood 2 or more times a week.  Have healthy snacks readily available, such as: ? Vegetable sticks with hummus. ? [place] yogurt. ? Fruit and nut trail mix.  Eat balanced meals throughout the week. This includes: ? Fruit: 2-3 servings a day ? Vegetables: 4-5 servings a day ? Low-fat dairy: 2 servings a day ? Fish, poultry, or lean meat: 1 serving a day ? Beans and legumes: 2 or more servings a week ? Nuts and seeds: 1-2 servings a day ? Whole grains: 6-8 servings a day ? Extra-virgin olive oil: 3-4 servings a day  Limit red meat and sweets to only a few servings a month What are my food choices?  Mediterranean diet ? Recommended ? Grains: Whole-grain pasta. Brown rice. Bulgar wheat. Polenta. Couscous. Whole-wheat bread. [place]. ? Vegetables: Artichokes. Beets. Broccoli. Cabbage. Carrots. Eggplant. Green beans. Chard. Kale. Spinach. Onions. Leeks. Peas. Squash. Tomatoes. Peppers. Radishes. ? Fruits: Apples. Apricots. Avocado. Berries. Bananas. Cherries. Dates. Figs. Grapes. Lemons. Melon. Oranges. Peaches. Plums. Pomegranate. ? Meats and other protein  foods: Beans. Almonds. Sunflower seeds. Pine nuts. Peanuts. [place]. Salmon. Scallops. Shrimp. [place]. Tilapia. Clams. Oysters. Eggs. ? Dairy: Low-fat milk. Cheese. Greek yogurt. ? Beverages: Water. Red wine. Herbal tea. ? Fats and oils: Extra virgin olive oil. Avocado oil. Grape seed oil. ? Sweets and desserts: [place] yogurt with honey. Baked apples. Poached pears. Trail mix. ? Seasoning and other foods: Basil. Cilantro. Coriander. Cumin. Mint. Parsley. Sage. Rosemary. Tarragon. Garlic. Oregano. Thyme. Pepper. Balsalmic vinegar. Tahini. Hummus. Tomato sauce. Olives. Mushrooms. ? Limit these ? Grains: Prepackaged pasta or  rice dishes. Prepackaged cereal with added sugar. ? Vegetables: Deep fried potatoes (french fries). ? Fruits: Fruit canned in syrup. ? Meats and other protein foods: Beef. Pork. Lamb. Poultry with skin. Hot dogs. [place] Salines. ? Dairy: Ice cream. Sour cream. Whole milk. ? Beverages: Juice. Sugar-sweetened soft drinks. Beer. Liquor and spirits. ? Fats and oils: Butter. Canola oil. Vegetable oil. Beef fat (tallow). Lard. ? Sweets and desserts: Cookies. Cakes. Pies. Candy. ? Seasoning and other foods: Mayonnaise. Premade sauces and marinades. ? The items listed may not be a complete list. Talk with your dietitian about what dietary choices are right for you. Summary  The Mediterranean diet includes both food and lifestyle choices.  Eat a variety of fresh fruits and vegetables, beans, nuts, seeds, and whole grains.  Limit the amount of red meat and sweets that you eat.  Talk with your health care provider about whether it is safe for you to drink red wine in moderation. This means 1 glass a day for nonpregnant women and 2 glasses a day for men. A glass of wine equals 5 oz (150 mL). This information is not intended to replace advice given to you by your health care provider. Make sure you discuss any questions you have with your health care provider. Document Released: 12/02/2015 Document Revised: 01/04/2016 Document Reviewed: 12/02/2015 Elsevier Interactive Patient Education  2018 [employer].  Stop Ortho Tri-Cyclin and start Norethindrone (Camila). Increase water intake, strive for at least 75 ounces/day.   Follow Mediterranena diet Continue regular exercise.  Recommend at least 30 minutes daily, 5 days per week of walking, jogging, biking, swimming, YouTube/Pinterest workout videos. You are doing a [place]! When you are ready to try the Nicoderm patch, please call clinic and we will send it in for you- YOU CAN DO IT! Follow-up in one year for physical. NICE TO SEE YOU!

## 2017-10-30 NOTE — Progress Notes (Signed)
Subjective:    Patient ID: Christie Greer, female    DOB: 09/28/1995, 22 y.o.   MRN: [id number]  HPI:  Christie Greer presents for CPE She is on Ortho Tri Cyclen Lo and denies SE She estimates to drink 30-40 oz water/day and has been increasing fruits/vegetables in diet. She has been walking/running each am 1-2 miles She continue to smoke 1/2 pack/day, declined smoking cessation today but will consider after "the busy summer season at work". She reports frontal HAs when in hot/humid temps She has not had migraine in years, but does have hx of migraine with aura  Her wt has been table for last 8-9 months, between 140-145, today it is 141 She denies abdominal pain or change in bowel habits  Healthcare Maintenance- She declined HPV vaccination  Patient Care Team    Relationship Specialty Notifications Start End  Julaine Fusianford,  D, NP PCP - General Family Medicine  06/07/16     Patient Active Problem List   Diagnosis Date Noted  . Encounter for screening examination for chlamydial infection 10/30/2017  . Vitamin D deficiency 05/01/2017  . Healthcare maintenance 05/01/2017  . Screening for cervical cancer 06/12/2016  . Surveillance for birth control, oral contraceptives 06/12/2016  . Epigastric pain 06/07/2016  . Family history of diabetes mellitus 06/07/2016  . Routine physical examination 06/07/2016  . Encounter for vitamin deficiency screening 06/07/2016  . Fatigue 06/07/2016  . Tobacco use 06/07/2016     Past Medical History:  Diagnosis Date  . Depression      Past Surgical History:  Procedure Laterality Date  . None       Family History  Problem Relation Age of Onset  . Alcohol abuse Mother        recovering alcoholic  . Diabetes Mother   . Colon cancer Paternal Grandmother   . Breast cancer Paternal Grandmother      Social History   Substance and Sexual Activity  Drug Use Yes  . Types: Marijuana   Comment: from time to time     Social History    Substance and Sexual Activity  Alcohol Use No   Comment: one drink every few weeks     Social History   Tobacco Use  Smoking Status Current Every Day Smoker  . Packs/day: 0.50  . Years: 6.00  . Pack years: 3.00  . Types: Cigarettes  Smokeless Tobacco Never Used     Outpatient Encounter Medications as of 10/30/2017  Medication Sig  . [DISCONTINUED] Norgestimate-Ethinyl Estradiol Triphasic (ORTHO TRI-CYCLEN LO) 0.18/0.215/0.25 MG-25 MCG tab Take 1 tablet by mouth daily.  . norethindrone (MICRONOR,CAMILA,ERRIN) 0.35 MG tablet Take 1 tablet (0.35 mg total) by mouth daily.   No facility-administered encounter medications on file as of 10/30/2017.     Allergies: Penicillins  Body mass index is 22.96 kg/m.  Blood pressure 101/65, pulse 82, height 5' 5.75" (1.67 m), weight 141 lb 3.2 oz (64 kg), last menstrual period 10/25/2017, SpO2 99 %.     Review of Systems  Constitutional: Negative for activity change, appetite change, chills, diaphoresis, fatigue, fever and unexpected weight change.  HENT: Negative for congestion.   Eyes: Negative for visual disturbance.  Respiratory: Negative for cough, chest tightness, shortness of breath, wheezing and stridor.   Cardiovascular: Negative for chest pain, palpitations and leg swelling.  Gastrointestinal: Negative for abdominal distention, abdominal pain, blood in stool, constipation, diarrhea, nausea, rectal pain and vomiting.  Endocrine: Negative for cold intolerance, heat intolerance, polydipsia, polyphagia and polyuria.  Genitourinary:  Negative for difficulty urinating.  Musculoskeletal: Negative for arthralgias, back pain, gait problem, joint swelling, myalgias, neck pain and neck stiffness.  Skin: Negative for color change, pallor, rash and wound.  Neurological: Negative for dizziness and headaches.  Hematological: Does not bruise/bleed easily.  Psychiatric/Behavioral: Negative for behavioral problems, confusion, decreased  concentration, dysphoric mood, hallucinations, self-injury, sleep disturbance and suicidal ideas. The patient is not nervous/anxious and is not hyperactive.        Objective:   Physical Exam  Constitutional: She is oriented to person, place, and time. She appears well-developed and well-nourished. No distress.  HENT:  Head: Normocephalic and atraumatic.  Right Ear: External ear normal. Tympanic membrane is not erythematous and not bulging. No decreased hearing is noted.  Left Ear: External ear normal. Tympanic membrane is not erythematous and not bulging. No decreased hearing is noted.  Nose: Nose normal. No mucosal edema. Right sinus exhibits no maxillary sinus tenderness and no frontal sinus tenderness. Left sinus exhibits no maxillary sinus tenderness and no frontal sinus tenderness.  Mouth/Throat: Uvula is midline and oropharynx is clear and moist.  Eyes: Pupils are equal, round, and reactive to light. Conjunctivae and EOM are normal.  Neck: Normal range of motion. Neck supple.  Cardiovascular: Normal rate, regular rhythm and intact distal pulses.  No murmur heard. Pulmonary/Chest: Effort normal and breath sounds normal. No stridor. No respiratory distress. She has no wheezes. She has no rales. She exhibits no tenderness.  Abdominal: Soft. Bowel sounds are normal. She exhibits no distension and no mass. There is no tenderness. There is no rebound and no guarding. No hernia.  Musculoskeletal: Normal range of motion. She exhibits no edema.  Lymphadenopathy:    She has no cervical adenopathy.  Neurological: She is alert and oriented to person, place, and time. Coordination normal.  Skin: Skin is warm and dry. Capillary refill takes less than 2 seconds. She is not diaphoretic. No erythema. No pallor.  Psychiatric: She has a normal mood and affect. Her behavior is normal. Judgment and thought content normal.  Nursing note and vitals reviewed.     Assessment & Plan:   1. Encounter for  screening examination for chlamydial infection   2. Healthcare maintenance   3. Surveillance for birth control, oral contraceptives     Healthcare maintenance Stop Ortho Tri-Cyclin and start Norethindrone (Camila). Increase water intake, strive for at least 75 ounces/day. Follow Mediterranena diet Continue regular exercise.  Recommend at least 30 minutes daily, 5 days per week of walking, jogging, biking, swimming, YouTube/Pinterest workout videos. You are doing a GREAT JOB WITH YOUR HEALTH! When you are ready to try the Nicoderm patch, please call clinic and we will send it in for you- YOU CAN DO IT! Follow-up in one year for physical.  Surveillance for birth control, oral contraceptives Due to migraine with aura and current smoking - stop Noregestimate- Ethinyl Estradiol Triphasic Start Norethindrone 0.35mg  QD    FOLLOW-UP:  Return in about 1 year (around 10/31/2018) for CPE.

## 2017-10-30 NOTE — Assessment & Plan Note (Signed)
Due to migraine with aura and current smoking - stop Noregestimate- Ethinyl Estradiol Triphasic Start Norethindrone 0.35mg  QD

## 2017-10-30 NOTE — Assessment & Plan Note (Addendum)
Stop Ortho Tri-Cyclin and start Norethindrone (Camila). Increase water intake, strive for at least 75 ounces/day. Follow Mediterranena diet Continue regular exercise.  Recommend at least 30 minutes daily, 5 days per week of walking, jogging, biking, swimming, YouTube/Pinterest workout videos. You are doing a GREAT JOB WITH YOUR HEALTH! When you are ready to try the Nicoderm patch, please call clinic and we will send it in for you- YOU CAN DO IT! Follow-up in one year for physical.

## 2017-10-31 ENCOUNTER — Encounter: Payer: Self-pay | Admitting: Adult Health

## 2017-10-31 ENCOUNTER — Telehealth: Payer: Self-pay | Admitting: Adult Health

## 2017-10-31 NOTE — Telephone Encounter (Signed)
MyChart message sent to patient.  Christie Greer. Nelson, CMA

## 2017-10-31 NOTE — Telephone Encounter (Signed)
Please advise.  T. Maxen Rowland, CMA 

## 2017-10-31 NOTE — Telephone Encounter (Signed)
Patient called states she was gvn New BC pills by provider 7/9 & just took her last one of Old pills -- she is unsure if to start New BC when  menstrual cycle starts or right now(today)---pls call her at [phone].  ---Forwarding message to medical assistant to contact patient.  --glh

## 2017-10-31 NOTE — Telephone Encounter (Signed)
Good Morning Tonya, Can you please call pt and tell her to follow packet insert in regards to changing BCP Thanks! Orpha BurKaty

## 2017-11-01 LAB — CHLAMYDIA/GONOCOCCUS/TRICHOMONAS, NAA
Chlamydia by NAA: NEGATIVE
Gonococcus by NAA: NEGATIVE
Trich vag by NAA: NEGATIVE

## 2018-03-12 DIAGNOSIS — H17823 Peripheral opacity of cornea, bilateral: Secondary | ICD-10-CM | POA: Diagnosis not present

## 2018-03-12 DIAGNOSIS — H35411 Lattice degeneration of retina, right eye: Secondary | ICD-10-CM | POA: Diagnosis not present

## 2018-03-12 DIAGNOSIS — H16403 Unspecified corneal neovascularization, bilateral: Secondary | ICD-10-CM | POA: Diagnosis not present

## 2018-10-28 NOTE — Progress Notes (Signed)
Subjective:    Patient ID: Christie Greer, female    DOB: Feb 25, 1996, 23 y.o.   MRN: [id number]  HPI:  11/09/2017 OV:   [name] presents for CPE She is on Ortho Tri Cyclen Lo and denies SE She estimates to drink 30-40 oz water/day and has been increasing fruits/vegetables in diet. She has been walking/running each am 1-2 miles She continue to smoke 1/2 pack/day, declined smoking cessation today but will consider after "the busy summer season at work". She reports frontal HAs when in hot/humid temps She has not had migraine in years, but does have hx of migraine with aura  Her wt has been table for last 8-9 months, between 140-145, today it is 141 She denies abdominal pain or change in bowel habits  10/30/2018 OV: [name] is here for CPE She reports resolution of abdominal pain, diarrhea. She reports 2 BMs daily- well formed stool, denies hematochezia She is slowly weaning off tobacco use- currently smoking 4 cigarettes/day- hopes to quite by next week. She estimates to drink >94 oz water/day, following heart healthy diet, hiking on weekends. She denies ETOH use She reports her mother recently relapsed into alcoholism-she is currently in detox center. On a happier note, she is planning her wedding for 02/01/2019 Her weight has been stable the last 8 months 145 lbs +/- 5  lbs Need Fasting Labs  Healthcare Maintenance: PAP-UTD, last 06/12/2016-normal Immunizations-UTD   Patient Care Team    Relationship Specialty Notifications Start End  Julaine Fusianford, Katy D, NP PCP - General Family Medicine  06/07/16     Patient Active Problem List   Diagnosis Date Noted  . Encounter for screening examination for chlamydial infection 10/30/2017  . Vitamin D deficiency 05/01/2017  . Healthcare maintenance 05/01/2017  . Screening for cervical cancer 06/12/2016  . Surveillance for birth control, oral contraceptives 06/12/2016  . Epigastric pain 06/07/2016  . Family history of diabetes  mellitus 06/07/2016  . Routine physical examination 06/07/2016  . Encounter for vitamin deficiency screening 06/07/2016  . Fatigue 06/07/2016  . Tobacco use 06/07/2016     Past Medical History:  Diagnosis Date  . Depression      Past Surgical History:  Procedure Laterality Date  . None       Family History  Problem Relation Age of Onset  . Alcohol abuse Mother        recovering alcoholic  . Diabetes Mother   . Colon cancer Paternal Grandmother   . Breast cancer Paternal Grandmother      Social History   Substance and Sexual Activity  Drug Use Yes  . Types: Marijuana   Comment: from time to time     Social History   Substance and Sexual Activity  Alcohol Use No   Comment: one drink every few weeks     Social History   Tobacco Use  Smoking Status Current Every Day Smoker  . Packs/day: 0.50  . Years: 6.00  . Pack years: 3.00  . Types: Cigarettes  Smokeless Tobacco Never Used     Outpatient Encounter Medications as of 10/30/2018  Medication Sig  . norethindrone (MICRONOR,CAMILA,ERRIN) 0.35 MG tablet Take 1 tablet (0.35 mg total) by mouth daily.   No facility-administered encounter medications on file as of 10/30/2018.     Allergies: Penicillins  Body mass index is 23.42 kg/m.  Blood pressure (!) 97/56, pulse (!) 59, temperature 98.6 F (37 C), temperature source Oral, height 5' 6.5" (1.689 m), weight 147 lb 4.8 oz (66.8  kg), SpO2 100 %.     Review of Systems  Constitutional: Positive for fatigue. Negative for activity change, appetite change, chills, diaphoresis, fever and unexpected weight change.  HENT: Negative for congestion.   Eyes: Negative for visual disturbance.  Respiratory: Negative for cough, chest tightness, shortness of breath, wheezing and stridor.   Cardiovascular: Negative for chest pain, palpitations and leg swelling.       Objective:   Physical Exam Vitals signs and nursing note reviewed.  Constitutional:       General: She is not in acute distress.    Appearance: She is normal weight. She is not ill-appearing, toxic-appearing or diaphoretic.  HENT:     Head: Normocephalic and atraumatic.     Right Ear: Tympanic membrane, ear canal and external ear normal.     Left Ear: Tympanic membrane, ear canal and external ear normal.     Nose: No congestion.     Mouth/Throat:     Mouth: Mucous membranes are moist.     Pharynx: Oropharynx is clear. No oropharyngeal exudate or posterior oropharyngeal erythema.  Eyes:     Extraocular Movements: Extraocular movements intact.     Conjunctiva/sclera: Conjunctivae normal.     Pupils: Pupils are equal, round, and reactive to light.  Neck:     Musculoskeletal: Normal range of motion and neck supple. No muscular tenderness.  Cardiovascular:     Rate and Rhythm: Normal rate.     Pulses: Normal pulses.     Heart sounds: Normal heart sounds. No murmur. No friction rub. No gallop.   Pulmonary:     Effort: Pulmonary effort is normal. No respiratory distress.     Breath sounds: Normal breath sounds. No stridor. No wheezing, rhonchi or rales.  Chest:     Chest wall: No tenderness.     Breasts:        Right: Normal. No swelling, inverted nipple, mass, nipple discharge, skin change or tenderness.        Left: Normal. No swelling, inverted nipple, mass, nipple discharge, skin change or tenderness.  Abdominal:     General: Abdomen is flat. Bowel sounds are normal. There is no distension.     Palpations: Abdomen is soft. There is no mass.     Tenderness: There is no abdominal tenderness. There is no right CVA tenderness, left CVA tenderness, guarding or rebound.     Hernia: No hernia is present.  Musculoskeletal: Normal range of motion.        General: No swelling, tenderness, deformity or signs of injury.     Right lower leg: No edema.     Left lower leg: No edema.  Lymphadenopathy:     Cervical: No cervical adenopathy.  Skin:    General: Skin is warm and dry.      Capillary Refill: Capillary refill takes less than 2 seconds.     Findings: No erythema.  Neurological:     Mental Status: She is alert and oriented to person, place, and time.     Coordination: Coordination normal.  Psychiatric:        Mood and Affect: Mood normal.        Behavior: Behavior normal.        Thought Content: Thought content normal.        Judgment: Judgment normal.       Assessment & Plan:   1. Healthcare maintenance   2. Vitamin D deficiency     Healthcare maintenance Remain well hydrated, follow heart healthy  diet, and continue regular exercise. Continue to wean off tobacco-you can do it! We will call you when lab results are available. Please schedule fasting lab appt in the next week or so. When you need a refill on birth control please contact your pharmacy. CONGRATULATIONS on your wedding! Thoughts and prayers for your mother. Continue to social distance and when out in public wear a mask. Recommend annual physical, fasting labs the week prior.    FOLLOW-UP:  Return in about 1 year (around 10/30/2019) for CPE, Fasting Labs.

## 2018-10-30 ENCOUNTER — Encounter: Payer: Self-pay | Admitting: Adult Health

## 2018-10-30 ENCOUNTER — Other Ambulatory Visit: Payer: Self-pay

## 2018-10-30 ENCOUNTER — Ambulatory Visit (INDEPENDENT_AMBULATORY_CARE_PROVIDER_SITE_OTHER): Payer: 59 | Admitting: Adult Health

## 2018-10-30 VITALS — BP 97/56 | HR 59 | Temp 98.6°F | Ht 66.5 in | Wt 147.3 lb

## 2018-10-30 DIAGNOSIS — Z0000 Encounter for general adult medical examination without abnormal findings: Secondary | ICD-10-CM | POA: Diagnosis not present

## 2018-10-30 DIAGNOSIS — E559 Vitamin D deficiency, unspecified: Secondary | ICD-10-CM

## 2018-10-30 NOTE — Patient Instructions (Addendum)
Preventive Care for Adults, Female  A healthy lifestyle and preventive care can promote health and wellness. Preventive health guidelines for women include the following key practices.   A routine yearly physical is a good way to check with your health care provider about your health and preventive screening. It is a chance to share any concerns and updates on your health and to receive a thorough exam.   Visit your dentist for a routine exam and preventive care every 6 months. Brush your teeth twice a day and floss once a day. Good oral hygiene prevents tooth decay and gum disease.   The frequency of eye exams is based on your age, health, family medical history, use of contact lenses, and other factors. Follow your health care provider's recommendations for frequency of eye exams.   Eat a healthy diet. Foods like vegetables, fruits, whole grains, low-fat dairy products, and lean protein foods contain the nutrients you need without too many calories. Decrease your intake of foods high in solid fats, added sugars, and salt. Eat the right amount of calories for you.Get information about a proper diet from your health care provider, if necessary.   Regular physical exercise is one of the most important things you can do for your health. Most adults should get at least 150 minutes of moderate-intensity exercise (any activity that increases your heart rate and causes you to sweat) each week. In addition, most adults need muscle-strengthening exercises on 2 or more days a week.   Maintain a healthy weight. The body mass index (BMI) is a screening tool to identify possible weight problems. It provides an estimate of body fat based on height and weight. Your health care provider can find your BMI, and can help you achieve or maintain a healthy weight.For adults 20 years and older:   - A BMI below 18.5 is considered underweight.   - A BMI of 18.5 to 24.9 is normal.   - A BMI of 25 to 29.9 is  considered overweight.   - A BMI of 30 and above is considered obese.   Maintain normal blood lipids and cholesterol levels by exercising and minimizing your intake of trans and saturated fats.  Eat a balanced diet with plenty of fruit and vegetables. Blood tests for lipids and cholesterol should begin at age 20 and be repeated every 5 years minimum.  If your lipid or cholesterol levels are high, you are over 40, or you are at high risk for heart disease, you may need your cholesterol levels checked more frequently.Ongoing high lipid and cholesterol levels should be treated with medicines if diet and exercise are not working.   If you smoke, find out from your health care provider how to quit. If you do not use tobacco, do not start.   Lung cancer screening is recommended for adults aged 55-80 years who are at high risk for developing lung cancer because of a history of smoking. A yearly low-dose CT scan of the lungs is recommended for people who have at least a 30-pack-year history of smoking and are a current smoker or have quit within the past 15 years. A pack year of smoking is smoking an average of 1 pack of cigarettes a day for 1 year (for example: 1 pack a day for 30 years or 2 packs a day for 15 years). Yearly screening should continue until the smoker has stopped smoking for at least 15 years. Yearly screening should be stopped for people who develop a   health problem that would prevent them from having lung cancer treatment.   If you are pregnant, do not drink alcohol. If you are breastfeeding, be very cautious about drinking alcohol. If you are not pregnant and choose to drink alcohol, do not have more than 1 drink per day. One drink is considered to be 12 ounces (355 mL) of beer, 5 ounces (148 mL) of wine, or 1.5 ounces (44 mL) of liquor.   Avoid use of street drugs. Do not share needles with anyone. Ask for help if you need support or instructions about stopping the use of  drugs.   High blood pressure causes heart disease and increases the risk of stroke. Your blood pressure should be checked at least yearly.  Ongoing high blood pressure should be treated with medicines if weight loss and exercise do not work.   If you are 69-55 years old, ask your health care provider if you should take aspirin to prevent strokes.   Diabetes screening involves taking a blood sample to check your fasting blood sugar level. This should be done once every 3 years, after age 38, if you are within normal weight and without risk factors for diabetes. Testing should be considered at a younger age or be carried out more frequently if you are overweight and have at least 1 risk factor for diabetes.   Breast cancer screening is essential preventive care for women. You should practice "breast self-awareness."  This means understanding the normal appearance and feel of your breasts and may include breast self-examination.  Any changes detected, no matter how small, should be reported to a health care provider.  Women in their 80s and 30s should have a clinical breast exam (CBE) by a health care provider as part of a regular health exam every 1 to 3 years.  After age 66, women should have a CBE every year.  Starting at age 1, women should consider having a mammogram (breast X-ray test) every year.  Women who have a family history of breast cancer should talk to their health care provider about genetic screening.  Women at a high risk of breast cancer should talk to their health care providers about having an MRI and a mammogram every year.   -Breast cancer gene (BRCA)-related cancer risk assessment is recommended for women who have family members with BRCA-related cancers. BRCA-related cancers include breast, ovarian, tubal, and peritoneal cancers. Having family members with these cancers may be associated with an increased risk for harmful changes (mutations) in the breast cancer genes BRCA1 and  BRCA2. Results of the assessment will determine the need for genetic counseling and BRCA1 and BRCA2 testing.   The Pap test is a screening test for cervical cancer. A Pap test can show cell changes on the cervix that might become cervical cancer if left untreated. A Pap test is a procedure in which cells are obtained and examined from the lower end of the uterus (cervix).   - Women should have a Pap test starting at age 57.   - Between ages 90 and 70, Pap tests should be repeated every 2 years.   - Beginning at age 63, you should have a Pap test every 3 years as long as the past 3 Pap tests have been normal.   - Some women have medical problems that increase the chance of getting cervical cancer. Talk to your health care provider about these problems. It is especially important to talk to your health care provider if a  new problem develops soon after your last Pap test. In these cases, your health care provider may recommend more frequent screening and Pap tests.   - The above recommendations are the same for women who have or have not gotten the vaccine for human papillomavirus (HPV).   - If you had a hysterectomy for a problem that was not cancer or a condition that could lead to cancer, then you no longer need Pap tests. Even if you no longer need a Pap test, a regular exam is a good idea to make sure no other problems are starting.   - If you are between ages 36 and 66 years, and you have had normal Pap tests going back 10 years, you no longer need Pap tests. Even if you no longer need a Pap test, a regular exam is a good idea to make sure no other problems are starting.   - If you have had past treatment for cervical cancer or a condition that could lead to cancer, you need Pap tests and screening for cancer for at least 20 years after your treatment.   - If Pap tests have been discontinued, risk factors (such as a new sexual partner) need to be reassessed to determine if screening should  be resumed.   - The HPV test is an additional test that may be used for cervical cancer screening. The HPV test looks for the virus that can cause the cell changes on the cervix. The cells collected during the Pap test can be tested for HPV. The HPV test could be used to screen women aged 70 years and older, and should be used in women of any age who have unclear Pap test results. After the age of 67, women should have HPV testing at the same frequency as a Pap test.   Colorectal cancer can be detected and often prevented. Most routine colorectal cancer screening begins at the age of 57 years and continues through age 26 years. However, your health care provider may recommend screening at an earlier age if you have risk factors for colon cancer. On a yearly basis, your health care provider may provide home test kits to check for hidden blood in the stool.  Use of a small camera at the end of a tube, to directly examine the colon (sigmoidoscopy or colonoscopy), can detect the earliest forms of colorectal cancer. Talk to your health care provider about this at age 23, when routine screening begins. Direct exam of the colon should be repeated every 5 -10 years through age 49 years, unless early forms of pre-cancerous polyps or small growths are found.   People who are at an increased risk for hepatitis B should be screened for this virus. You are considered at high risk for hepatitis B if:  -You were born in a country where hepatitis B occurs often. Talk with your health care provider about which countries are considered high risk.  - Your parents were born in a high-risk country and you have not received a shot to protect against hepatitis B (hepatitis B vaccine).  - You have HIV or AIDS.  - You use needles to inject street drugs.  - You live with, or have sex with, someone who has Hepatitis B.  - You get hemodialysis treatment.  - You take certain medicines for conditions like cancer, organ  transplantation, and autoimmune conditions.   Hepatitis C blood testing is recommended for all people born from [birth year] through 1965 and any individual  with known risks for hepatitis C.   Practice safe sex. Use condoms and avoid high-risk sexual practices to reduce the spread of sexually transmitted infections (STIs). STIs include gonorrhea, chlamydia, syphilis, trichomonas, herpes, HPV, and human immunodeficiency virus (HIV). Herpes, HIV, and HPV are viral illnesses that have no cure. They can result in disability, cancer, and death. Sexually active women aged 25 years and younger should be checked for chlamydia. Older women with new or multiple partners should also be tested for chlamydia. Testing for other STIs is recommended if you are sexually active and at increased risk.   Osteoporosis is a disease in which the bones lose minerals and strength with aging. This can result in serious bone fractures or breaks. The risk of osteoporosis can be identified using a bone density scan. Women ages 65 years and over and women at risk for fractures or osteoporosis should discuss screening with their health care providers. Ask your health care provider whether you should take a calcium supplement or vitamin D to There are also several preventive steps women can take to avoid osteoporosis and resulting fractures or to keep osteoporosis from worsening. -->Recommendations include:  Eat a balanced diet high in fruits, vegetables, calcium, and vitamins.  Get enough calcium. The recommended total intake of is 1,200 mg daily; for best absorption, if taking supplements, divide doses into 250-500 mg doses throughout the day. Of the two types of calcium, calcium carbonate is best absorbed when taken with food but calcium citrate can be taken on an empty stomach.  Get enough vitamin D. NAMS and the National Osteoporosis Foundation recommend at least 1,000 IU per day for women age 50 and over who are at risk of vitamin D  deficiency. Vitamin D deficiency can be caused by inadequate sun exposure (for example, those who live in northern latitudes).  Avoid alcohol and smoking. Heavy alcohol intake (more than 7 drinks per week) increases the risk of falls and hip fracture and women smokers tend to lose bone more rapidly and have lower bone mass than nonsmokers. Stopping smoking is one of the most important changes women can make to improve their health and decrease risk for disease.  Be physically active every day. Weight-bearing exercise (for example, fast walking, hiking, jogging, and weight training) may strengthen bones or slow the rate of bone loss that comes with aging. Balancing and muscle-strengthening exercises can reduce the risk of falling and fracture.  Consider therapeutic medications. Currently, several types of effective drugs are available. Healthcare providers can recommend the type most appropriate for each woman.  Eliminate environmental factors that may contribute to accidents. Falls cause nearly 90% of all osteoporotic fractures, so reducing this risk is an important bone-health strategy. Measures include ample lighting, removing obstructions to walking, using nonskid rugs on floors, and placing mats and/or grab bars in showers.  Be aware of medication side effects. Some common medicines make bones weaker. These include a type of steroid drug called glucocorticoids used for arthritis and asthma, some antiseizure drugs, certain sleeping pills, treatments for endometriosis, and some cancer drugs. An overactive thyroid gland or using too much thyroid hormone for an underactive thyroid can also be a problem. If you are taking these medicines, talk to your doctor about what you can do to help protect your bones.reduce the rate of osteoporosis.    Menopause can be associated with physical symptoms and risks. Hormone replacement therapy is available to decrease symptoms and risks. You should talk to your  health care provider   about whether hormone replacement therapy is right for you.   Use sunscreen. Apply sunscreen liberally and repeatedly throughout the day. You should seek shade when your shadow is shorter than you. Protect yourself by wearing long sleeves, pants, a wide-brimmed hat, and sunglasses year round, whenever you are outdoors.   Once a month, do a whole body skin exam, using a mirror to look at the skin on your back. Tell your health care provider of new moles, moles that have irregular borders, moles that are larger than a pencil eraser, or moles that have changed in shape or color.   -Stay current with required vaccines (immunizations).   Influenza vaccine. All adults should be immunized every year.  Tetanus, diphtheria, and acellular pertussis (Td, Tdap) vaccine. Pregnant women should receive 1 dose of Tdap vaccine during each pregnancy. The dose should be obtained regardless of the length of time since the last dose. Immunization is preferred during the 27th 36th week of gestation. An adult who has not previously received Tdap or who does not know her vaccine status should receive 1 dose of Tdap. This initial dose should be followed by tetanus and diphtheria toxoids (Td) booster doses every 10 years. Adults with an unknown or incomplete history of completing a 3-dose immunization series with Td-containing vaccines should begin or complete a primary immunization series including a Tdap dose. Adults should receive a Td booster every 10 years.  Varicella vaccine. An adult without evidence of immunity to varicella should receive 2 doses or a second dose if she has previously received 1 dose. Pregnant females who do not have evidence of immunity should receive the first dose after pregnancy. This first dose should be obtained before leaving the health care facility. The second dose should be obtained 4 8 weeks after the first dose.  Human papillomavirus (HPV) vaccine. Females aged 13 26  years who have not received the vaccine previously should obtain the 3-dose series. The vaccine is not recommended for use in pregnant females. However, pregnancy testing is not needed before receiving a dose. If a female is found to be pregnant after receiving a dose, no treatment is needed. In that case, the remaining doses should be delayed until after the pregnancy. Immunization is recommended for any person with an immunocompromised condition through the age of 26 years if she did not get any or all doses earlier. During the 3-dose series, the second dose should be obtained 4 8 weeks after the first dose. The third dose should be obtained 24 weeks after the first dose and 16 weeks after the second dose.  Zoster vaccine. One dose is recommended for adults aged 60 years or older unless certain conditions are present.  Measles, mumps, and rubella (MMR) vaccine. Adults born before 1957 generally are considered immune to measles and mumps. Adults born in 1957 or later should have 1 or more doses of MMR vaccine unless there is a contraindication to the vaccine or there is laboratory evidence of immunity to each of the three diseases. A routine second dose of MMR vaccine should be obtained at least 28 days after the first dose for students attending postsecondary schools, health care workers, or international travelers. People who received inactivated measles vaccine or an unknown type of measles vaccine during 1963 1967 should receive 2 doses of MMR vaccine. People who received inactivated mumps vaccine or an unknown type of mumps vaccine before 1979 and are at high risk for mumps infection should consider immunization with 2 doses of   MMR vaccine. For females of childbearing age, rubella immunity should be determined. If there is no evidence of immunity, females who are not pregnant should be vaccinated. If there is no evidence of immunity, females who are pregnant should delay immunization until after pregnancy.  Unvaccinated health care workers born before [birth year] who lack laboratory evidence of measles, mumps, or rubella immunity or laboratory confirmation of disease should consider measles and mumps immunization with 2 doses of MMR vaccine or rubella immunization with 1 dose of MMR vaccine.  Pneumococcal 13-valent conjugate (PCV13) vaccine. When indicated, a person who is uncertain of her immunization history and has no record of immunization should receive the PCV13 vaccine. An adult aged 54 years or older who has certain medical conditions and has not been previously immunized should receive 1 dose of PCV13 vaccine. This PCV13 should be followed with a dose of pneumococcal polysaccharide (PPSV23) vaccine. The PPSV23 vaccine dose should be obtained at least 8 weeks after the dose of PCV13 vaccine. An adult aged 58 years or older who has certain medical conditions and previously received 1 or more doses of PPSV23 vaccine should receive 1 dose of PCV13. The PCV13 vaccine dose should be obtained 1 or more years after the last PPSV23 vaccine dose.  Pneumococcal polysaccharide (PPSV23) vaccine. When PCV13 is also indicated, PCV13 should be obtained first. All adults aged 58 years and older should be immunized. An adult younger than age 65 years who has certain medical conditions should be immunized. Any person who resides in a nursing home or long-term care facility should be immunized. An adult smoker should be immunized. People with an immunocompromised condition and certain other conditions should receive both PCV13 and PPSV23 vaccines. People with human immunodeficiency virus (HIV) infection should be immunized as soon as possible after diagnosis. Immunization during chemotherapy or radiation therapy should be avoided. Routine use of PPSV23 vaccine is not recommended for American Indians, [place] Natives, or people younger than 65 years unless there are medical conditions that require PPSV23 vaccine. When indicated,  people who have unknown immunization and have no record of immunization should receive PPSV23 vaccine. One-time revaccination 5 years after the first dose of PPSV23 is recommended for people aged 70 64 years who have chronic kidney failure, nephrotic syndrome, asplenia, or immunocompromised conditions. People who received 1 2 doses of PPSV23 before age 32 years should receive another dose of PPSV23 vaccine at age 96 years or later if at least 5 years have passed since the previous dose. Doses of PPSV23 are not needed for people immunized with PPSV23 at or after age 55 years.  Meningococcal vaccine. Adults with asplenia or persistent complement component deficiencies should receive 2 doses of quadrivalent meningococcal conjugate (MenACWY-D) vaccine. The doses should be obtained at least 2 months apart. Microbiologists working with certain meningococcal bacteria, [employer] recruits, people at risk during an outbreak, and people who travel to or live in countries with a high rate of meningitis should be immunized. A first-year college student up through age 58 years who is living in a residence hall should receive a dose if she did not receive a dose on or after her 16th birthday. Adults who have certain high-risk conditions should receive one or more doses of vaccine.  Hepatitis A vaccine. Adults who wish to be protected from this disease, have certain high-risk conditions, work with hepatitis A-infected animals, work in hepatitis A research labs, or travel to or work in countries with a high rate of hepatitis A should be  immunized. Adults who were previously unvaccinated and who anticipate close contact with an international adoptee during the first 60 days after arrival in the [place] from a country with a high rate of hepatitis A should be immunized.  Hepatitis B vaccine.  Adults who wish to be protected from this disease, have certain high-risk conditions, may be exposed to blood or other infectious  body fluids, are household contacts or sex partners of hepatitis B positive people, are clients or workers in certain care facilities, or travel to or work in countries with a high rate of hepatitis B should be immunized.  Haemophilus influenzae type b (Hib) vaccine. A previously unvaccinated person with asplenia or sickle cell disease or having a scheduled splenectomy should receive 1 dose of Hib vaccine. Regardless of previous immunization, a recipient of a hematopoietic stem cell transplant should receive a 3-dose series 6 12 months after her successful transplant. Hib vaccine is not recommended for adults with HIV infection.  Preventive Services / Frequency Ages 6 to 39years  Blood pressure check.** / Every 1 to 2 years.  Lipid and cholesterol check.** / Every 5 years beginning at age 39.  Clinical breast exam.** / Every 3 years for women in their 61s and 62s.  BRCA-related cancer risk assessment.** / For women who have family members with a BRCA-related cancer (breast, ovarian, tubal, or peritoneal cancers).  Pap test.** / Every 2 years from ages 47 through 85. Every 3 years starting at age 34 through age 12 or 74 with a history of 3 consecutive normal Pap tests.  HPV screening.** / Every 3 years from ages 46 through ages 43 to 54 with a history of 3 consecutive normal Pap tests.  Hepatitis C blood test.** / For any individual with known risks for hepatitis C.  Skin self-exam. / Monthly.  Influenza vaccine. / Every year.  Tetanus, diphtheria, and acellular pertussis (Tdap, Td) vaccine.** / Consult your health care provider. Pregnant women should receive 1 dose of Tdap vaccine during each pregnancy. 1 dose of Td every 10 years.  Varicella vaccine.** / Consult your health care provider. Pregnant females who do not have evidence of immunity should receive the first dose after pregnancy.  HPV vaccine. / 3 doses over 6 months, if 64 and younger. The vaccine is not recommended for use in  pregnant females. However, pregnancy testing is not needed before receiving a dose.  Measles, mumps, rubella (MMR) vaccine.** / You need at least 1 dose of MMR if you were born in 1957 or later. You may also need a 2nd dose. For females of childbearing age, rubella immunity should be determined. If there is no evidence of immunity, females who are not pregnant should be vaccinated. If there is no evidence of immunity, females who are pregnant should delay immunization until after pregnancy.  Pneumococcal 13-valent conjugate (PCV13) vaccine.** / Consult your health care provider.  Pneumococcal polysaccharide (PPSV23) vaccine.** / 1 to 2 doses if you smoke cigarettes or if you have certain conditions.  Meningococcal vaccine.** / 1 dose if you are age 71 to 37 years and a Market researcher living in a residence hall, or have one of several medical conditions, you need to get vaccinated against meningococcal disease. You may also need additional booster doses.  Hepatitis A vaccine.** / Consult your health care provider.  Hepatitis B vaccine.** / Consult your health care provider.  Haemophilus influenzae type b (Hib) vaccine.** / Consult your health care provider.  Ages 55 to 64years  Blood pressure check.** / Every 1 to 2 years.  Lipid and cholesterol check.** / Every 5 years beginning at age 20 years.  Lung cancer screening. / Every year if you are aged 55 80 years and have a 30-pack-year history of smoking and currently smoke or have quit within the past 15 years. Yearly screening is stopped once you have quit smoking for at least 15 years or develop a health problem that would prevent you from having lung cancer treatment.  Clinical breast exam.** / Every year after age 40 years.  BRCA-related cancer risk assessment.** / For women who have family members with a BRCA-related cancer (breast, ovarian, tubal, or peritoneal cancers).  Mammogram.** / Every year beginning at age 40  years and continuing for as long as you are in good health. Consult with your health care provider.  Pap test.** / Every 3 years starting at age 30 years through age 65 or 70 years with a history of 3 consecutive normal Pap tests.  HPV screening.** / Every 3 years from ages 30 years through ages 65 to 70 years with a history of 3 consecutive normal Pap tests.  Fecal occult blood test (FOBT) of stool. / Every year beginning at age 50 years and continuing until age 75 years. You may not need to do this test if you get a colonoscopy every 10 years.  Flexible sigmoidoscopy or colonoscopy.** / Every 5 years for a flexible sigmoidoscopy or every 10 years for a colonoscopy beginning at age 50 years and continuing until age 75 years.  Hepatitis C blood test.** / For all people born from 1945 through 1965 and any individual with known risks for hepatitis C.  Skin self-exam. / Monthly.  Influenza vaccine. / Every year.  Tetanus, diphtheria, and acellular pertussis (Tdap/Td) vaccine.** / Consult your health care provider. Pregnant women should receive 1 dose of Tdap vaccine during each pregnancy. 1 dose of Td every 10 years.  Varicella vaccine.** / Consult your health care provider. Pregnant females who do not have evidence of immunity should receive the first dose after pregnancy.  Zoster vaccine.** / 1 dose for adults aged 60 years or older.  Measles, mumps, rubella (MMR) vaccine.** / You need at least 1 dose of MMR if you were born in 1957 or later. You may also need a 2nd dose. For females of childbearing age, rubella immunity should be determined. If there is no evidence of immunity, females who are not pregnant should be vaccinated. If there is no evidence of immunity, females who are pregnant should delay immunization until after pregnancy.  Pneumococcal 13-valent conjugate (PCV13) vaccine.** / Consult your health care provider.  Pneumococcal polysaccharide (PPSV23) vaccine.** / 1 to 2 doses if  you smoke cigarettes or if you have certain conditions.  Meningococcal vaccine.** / Consult your health care provider.  Hepatitis A vaccine.** / Consult your health care provider.  Hepatitis B vaccine.** / Consult your health care provider.  Haemophilus influenzae type b (Hib) vaccine.** / Consult your health care provider.  Ages 65 years and over  Blood pressure check.** / Every 1 to 2 years.  Lipid and cholesterol check.** / Every 5 years beginning at age 20 years.  Lung cancer screening. / Every year if you are aged 55 80 years and have a 30-pack-year history of smoking and currently smoke or have quit within the past 15 years. Yearly screening is stopped once you have quit smoking for at least 15 years or develop a health problem that   would prevent you from having lung cancer treatment.  Clinical breast exam.** / Every year after age 103 years.  BRCA-related cancer risk assessment.** / For women who have family members with a BRCA-related cancer (breast, ovarian, tubal, or peritoneal cancers).  Mammogram.** / Every year beginning at age 36 years and continuing for as long as you are in good health. Consult with your health care provider.  Pap test.** / Every 3 years starting at age 5 years through age 85 or 10 years with 3 consecutive normal Pap tests. Testing can be stopped between 65 and 70 years with 3 consecutive normal Pap tests and no abnormal Pap or HPV tests in the past 10 years.  HPV screening.** / Every 3 years from ages 93 years through ages 70 or 45 years with a history of 3 consecutive normal Pap tests. Testing can be stopped between 65 and 70 years with 3 consecutive normal Pap tests and no abnormal Pap or HPV tests in the past 10 years.  Fecal occult blood test (FOBT) of stool. / Every year beginning at age 8 years and continuing until age 45 years. You may not need to do this test if you get a colonoscopy every 10 years.  Flexible sigmoidoscopy or colonoscopy.** /  Every 5 years for a flexible sigmoidoscopy or every 10 years for a colonoscopy beginning at age 69 years and continuing until age 68 years.  Hepatitis C blood test.** / For all people born from [birth year] through 1967 and any individual with known risks for hepatitis C.  Osteoporosis screening.** / A one-time screening for women ages 7 years and over and women at risk for fractures or osteoporosis.  Skin self-exam. / Monthly.  Influenza vaccine. / Every year.  Tetanus, diphtheria, and acellular pertussis (Tdap/Td) vaccine.** / 1 dose of Td every 10 years.  Varicella vaccine.** / Consult your health care provider.  Zoster vaccine.** / 1 dose for adults aged 5 years or older.  Pneumococcal 13-valent conjugate (PCV13) vaccine.** / Consult your health care provider.  Pneumococcal polysaccharide (PPSV23) vaccine.** / 1 dose for all adults aged 74 years and older.  Meningococcal vaccine.** / Consult your health care provider.  Hepatitis A vaccine.** / Consult your health care provider.  Hepatitis B vaccine.** / Consult your health care provider.  Haemophilus influenzae type b (Hib) vaccine.** / Consult your health care provider. ** Family history and personal history of risk and conditions may change your health care provider's recommendations. Document Released: 06/06/2001 Document Revised: 01/29/2013  [facility] Patient Information 2014 [place], [place].   EXERCISE AND DIET:  We recommended that you start or continue a regular exercise program for good health. Regular exercise means any activity that makes your heart beat faster and makes you sweat.  We recommend exercising at least 30 minutes per day at least 3 days a week, preferably 5.  We also recommend a diet low in fat and sugar / carbohydrates.  Inactivity, poor dietary choices and obesity can cause diabetes, heart attack, stroke, and kidney damage, among others.     ALCOHOL AND SMOKING:  Women should limit their alcohol intake to no  more than 7 drinks/beers/glasses of wine (combined, not each!) per week. Moderation of alcohol intake to this level decreases your risk of breast cancer and liver damage.  ( And of course, no recreational drugs are part of a healthy lifestyle.)  Also, you should not be smoking at all or even being exposed to second hand smoke. Most people know smoking can  cause cancer, and various heart and lung diseases, but did you know it also contributes to weakening of your bones?  Aging of your skin?  Yellowing of your teeth and nails?   CALCIUM AND VITAMIN D:  Adequate intake of calcium and Vitamin D are recommended.  The recommendations for exact amounts of these supplements seem to change often, but generally speaking 600 mg of calcium (either carbonate or citrate) and 800 units of Vitamin D per day seems prudent. Certain women may benefit from higher intake of Vitamin D.  If you are among these women, your doctor will have told you during your visit.     PAP SMEARS:  Pap smears, to check for cervical cancer or precancers,  have traditionally been done yearly, although recent scientific advances have shown that most women can have pap smears less often.  However, every woman still should have a physical exam from her gynecologist or primary care physician every year. It will include a breast check, inspection of the vulva and vagina to check for abnormal growths or skin changes, a visual exam of the cervix, and then an exam to evaluate the size and shape of the uterus and ovaries.  And after 25 years of age, a rectal exam is indicated to check for rectal cancers. We will also provide age appropriate advice regarding health maintenance, like when you should have certain vaccines, screening for sexually transmitted diseases, bone density testing, colonoscopy, mammograms, etc.    MAMMOGRAMS:  All women over 35 years old should have a yearly mammogram. Many facilities now offer a "3D" mammogram, which may cost  around $50 extra out of pocket. If possible,  we recommend you accept the option to have the 3D mammogram performed.  It both reduces the number of women who will be called back for extra views which then turn out to be normal, and it is better than the routine mammogram at detecting truly abnormal areas.     COLONOSCOPY:  Colonoscopy to screen for colon cancer is recommended for all women at age 73.  We know, you hate the idea of the prep.  We agree, BUT, having colon cancer and not knowing it is worse!!  Colon cancer so often starts as a polyp that can be seen and removed at colonscopy, which can quite literally save your life!  And if your first colonoscopy is normal and you have no family history of colon cancer, most women don't have to have it again for 10 years.  Once every ten years, you can do something that may end up saving your life, right?  We will be happy to help you get it scheduled when you are ready.  Be sure to check your insurance coverage so you understand how much it will cost.  It may be covered as a preventative service at no cost, but you should check your particular policy.    Remain well hydrated, follow heart healthy diet, and continue regular exercise. Continue to wean off tobacco-you can do it! We will call you when lab results are available. Please schedule fasting lab appt in the next week or so. When you need a refill on birth control please contact your pharmacy. CONGRATULATIONS on your wedding! Thoughts and prayers for your mother. Continue to social distance and when out in public wear a mask. Recommend annual physical, fasting labs the week prior. GREAT TO SEE YOU!

## 2018-10-30 NOTE — Assessment & Plan Note (Signed)
Remain well hydrated, follow heart healthy diet, and continue regular exercise. Continue to wean off tobacco-you can do it! We will call you when lab results are available. Please schedule fasting lab appt in the next week or so. When you need a refill on birth control please contact your pharmacy. CONGRATULATIONS on your wedding! Thoughts and prayers for your mother. Continue to social distance and when out in public wear a mask. Recommend annual physical, fasting labs the week prior.

## 2018-10-31 LAB — CBC WITH DIFFERENTIAL/PLATELET
Basophils Absolute: 0 10*3/uL (ref 0.0–0.2)
Basos: 1 %
EOS (ABSOLUTE): 0.2 10*3/uL (ref 0.0–0.4)
Eos: 4 %
Hematocrit: 39.9 % (ref 34.0–46.6)
Hemoglobin: 13.5 g/dL (ref 11.1–15.9)
Immature Grans (Abs): 0 10*3/uL (ref 0.0–0.1)
Immature Granulocytes: 0 %
Lymphocytes Absolute: 1.2 10*3/uL (ref 0.7–3.1)
Lymphs: 27 %
MCH: 29.3 pg (ref 26.6–33.0)
MCHC: 33.8 g/dL (ref 31.5–35.7)
MCV: 87 fL (ref 79–97)
Monocytes Absolute: 0.3 10*3/uL (ref 0.1–0.9)
Monocytes: 7 %
Neutrophils Absolute: 2.7 10*3/uL (ref 1.4–7.0)
Neutrophils: 61 %
Platelets: 249 10*3/uL (ref 150–450)
RBC: 4.6 x10E6/uL (ref 3.77–5.28)
RDW: 12.7 % (ref 11.7–15.4)
WBC: 4.5 10*3/uL (ref 3.4–10.8)

## 2018-10-31 LAB — HEMOGLOBIN A1C
Est. average glucose Bld gHb Est-mCnc: 100 mg/dL
Hgb A1c MFr Bld: 5.1 % (ref 4.8–5.6)

## 2018-10-31 LAB — TSH: TSH: 1.02 u[IU]/mL (ref 0.450–4.500)

## 2018-10-31 LAB — COMPREHENSIVE METABOLIC PANEL
ALT: 10 IU/L (ref 0–32)
AST: 18 IU/L (ref 0–40)
Albumin/Globulin Ratio: 2.3 — ABNORMAL HIGH (ref 1.2–2.2)
Albumin: 4.5 g/dL (ref 3.9–5.0)
Alkaline Phosphatase: 52 IU/L (ref 39–117)
BUN/Creatinine Ratio: 18 (ref 9–23)
BUN: 13 mg/dL (ref 6–20)
Bilirubin Total: 0.4 mg/dL (ref 0.0–1.2)
CO2: 23 mmol/L (ref 20–29)
Calcium: 9.9 mg/dL (ref 8.7–10.2)
Chloride: 103 mmol/L (ref 96–106)
Creatinine, Ser: 0.73 mg/dL (ref 0.57–1.00)
GFR calc Af Amer: 133 mL/min/{1.73_m2} (ref >59)
GFR calc non Af Amer: 116 mL/min/{1.73_m2} (ref >59)
Globulin, Total: 2 g/dL (ref 1.5–4.5)
Glucose: 62 mg/dL — ABNORMAL LOW (ref 65–99)
Potassium: 4.9 mmol/L (ref 3.5–5.2)
Sodium: 140 mmol/L (ref 134–144)
Total Protein: 6.5 g/dL (ref 6.0–8.5)

## 2018-10-31 LAB — VITAMIN D 25 HYDROXY (VIT D DEFICIENCY, FRACTURES): Vit D, 25-Hydroxy: 30.5 ng/mL (ref 30.0–100.0)

## 2018-12-16 ENCOUNTER — Other Ambulatory Visit: Payer: Self-pay | Admitting: Adult Health

## 2019-09-23 ENCOUNTER — Other Ambulatory Visit: Payer: Self-pay

## 2019-09-23 ENCOUNTER — Ambulatory Visit: Payer: 59 | Admitting: Physician Assistant

## 2019-09-23 ENCOUNTER — Other Ambulatory Visit (HOSPITAL_COMMUNITY)
Admission: RE | Admit: 2019-09-23 | Discharge: 2019-09-23 | Disposition: A | Discharge status: Home / Self Care | Payer: 59 | Source: Ambulatory Visit | Attending: Physician Assistant | Admitting: Physician Assistant

## 2019-09-23 ENCOUNTER — Encounter: Payer: Self-pay | Admitting: Physician Assistant

## 2019-09-23 VITALS — BP 122/75 | HR 80 | Temp 98.4°F | Ht 66.5 in | Wt 141.9 lb

## 2019-09-23 DIAGNOSIS — Z124 Encounter for screening for malignant neoplasm of cervix: Secondary | ICD-10-CM | POA: Diagnosis not present

## 2019-09-23 DIAGNOSIS — Z3041 Encounter for surveillance of contraceptive pills: Secondary | ICD-10-CM

## 2019-09-23 DIAGNOSIS — Z0000 Encounter for general adult medical examination without abnormal findings: Secondary | ICD-10-CM | POA: Diagnosis not present

## 2019-09-23 MED ORDER — NORETHINDRONE 0.35 MG PO TABS: 1.0000 | ORAL_TABLET | Freq: Every day | ORAL | 11 refills | Status: DC

## 2019-09-23 NOTE — Patient Instructions (Addendum)
Contraception Choices Contraception, also called birth control, refers to methods or devices that prevent pregnancy. Hormonal methods Contraceptive implant  A contraceptive implant is a thin, plastic tube that contains a hormone. It is inserted into the upper part of the arm. It can remain in place for up to 3 years. Progestin-only injections Progestin-only injections are injections of progestin, a synthetic form of the hormone progesterone. They are given every 3 months by a health care provider. Birth control pills  Birth control pills are pills that contain hormones that prevent pregnancy. They must be taken once a day, preferably at the same time each day. Birth control patch  The birth control patch contains hormones that prevent pregnancy. It is placed on the skin and must be changed once a week for three weeks and removed on the fourth week. A prescription is needed to use this method of contraception. Vaginal ring  A vaginal ring contains hormones that prevent pregnancy. It is placed in the vagina for three weeks and removed on the fourth week. After that, the process is repeated with a new ring. A prescription is needed to use this method of contraception. Emergency contraceptive Emergency contraceptives prevent pregnancy after unprotected sex. They come in pill form and can be taken up to 5 days after sex. They work best the sooner they are taken after having sex. Most emergency contraceptives are available without a prescription. This method should not be used as your only form of birth control. Barrier methods Female condom  A female condom is a thin sheath that is worn over the penis during sex. Condoms keep sperm from going inside a woman's body. They can be used with a spermicide to increase their effectiveness. They should be disposed after a single use. Female condom  A female condom is a soft, loose-fitting sheath that is put into the vagina before sex. The condom keeps sperm  from going inside a woman's body. They should be disposed after a single use. Diaphragm  A diaphragm is a soft, dome-shaped barrier. It is inserted into the vagina before sex, along with a spermicide. The diaphragm blocks sperm from entering the uterus, and the spermicide kills sperm. A diaphragm should be left in the vagina for 6-8 hours after sex and removed within 24 hours. A diaphragm is prescribed and fitted by a health care provider. A diaphragm should be replaced every 1-2 years, after giving birth, after gaining more than 15 lb (6.8 kg), and after pelvic surgery. Cervical cap  A cervical cap is a round, soft latex or plastic cup that fits over the cervix. It is inserted into the vagina before sex, along with spermicide. It blocks sperm from entering the uterus. The cap should be left in place for 6-8 hours after sex and removed within 48 hours. A cervical cap must be prescribed and fitted by a health care provider. It should be replaced every 2 years. Sponge  A sponge is a soft, circular piece of polyurethane foam with spermicide on it. The sponge helps block sperm from entering the uterus, and the spermicide kills sperm. To use it, you make it wet and then insert it into the vagina. It should be inserted before sex, left in for at least 6 hours after sex, and removed and thrown away within 30 hours. Spermicides Spermicides are chemicals that kill or block sperm from entering the cervix and uterus. They can come as a cream, jelly, suppository, foam, or tablet. A spermicide should be inserted into the   vagina with an applicator at least 10-15 minutes before sex to allow time for it to work. The process must be repeated every time you have sex. Spermicides do not require a prescription. Intrauterine contraception Intrauterine device (IUD) An IUD is a T-shaped device that is put in a woman's uterus. There are two types:  Hormone IUD.This type contains progestin, a synthetic form of the hormone  progesterone. This type can stay in place for 3-5 years.  Copper IUD.This type is wrapped in copper wire. It can stay in place for 10 years.  Permanent methods of contraception Female tubal ligation In this method, a woman's fallopian tubes are sealed, tied, or blocked during surgery to prevent eggs from traveling to the uterus. Hysteroscopic sterilization In this method, a small, flexible insert is placed into each fallopian tube. The inserts cause scar tissue to form in the fallopian tubes and block them, so sperm cannot reach an egg. The procedure takes about 3 months to be effective. Another form of birth control must be used during those 3 months. Female sterilization This is a procedure to tie off the tubes that carry sperm (vasectomy). After the procedure, the man can still ejaculate fluid (semen). Natural planning methods Natural family planning In this method, a couple does not have sex on days when the woman could become pregnant. Calendar method This means keeping track of the length of each menstrual cycle, identifying the days when pregnancy can happen, and not having sex on those days. Ovulation method In this method, a couple avoids sex during ovulation. Symptothermal method This method involves not having sex during ovulation. The woman typically checks for ovulation by watching changes in her temperature and in the consistency of cervical mucus. Post-ovulation method In this method, a couple waits to have sex until after ovulation. Summary  Contraception, also called birth control, means methods or devices that prevent pregnancy.  Hormonal methods of contraception include implants, injections, pills, patches, vaginal rings, and emergency contraceptives.  Barrier methods of contraception can include female condoms, female condoms, diaphragms, cervical caps, sponges, and spermicides.  There are two types of IUDs (intrauterine devices). An IUD can be put in a woman's uterus to  prevent pregnancy for 3-5 years.  Permanent sterilization can be done through a procedure for males, females, or both.  Natural family planning methods involve not having sex on days when the woman could become pregnant. This information is not intended to replace advice given to you by your health care provider. Make sure you discuss any questions you have with your health care provider. Document Revised: 04/12/2017 Document Reviewed: 05/13/2016 Elsevier Patient Education  2020 Elsevier Inc[employer]  

## 2019-09-23 NOTE — Progress Notes (Signed)
Established Patient Office Visit  Subjective:  Patient ID: Christie Greer, female    DOB: [birth year]  Age: 24 y.o. MRN: [id number]  CC:  Chief Complaint  Patient presents with  . Gynecologic Exam    HPI [name] presents for birth control refill. Pt has significantly decreased her smoking and is down to about 5 cigarettes/day. She plans to continue decreasing her use and has set her quit date 10/08/19. States they are in the process of buying a house and closing date is 10/08/19 which has motivated her to quit. Reports drinking water as a way to cope with cravings which has been effective. She is due for a pap.    Past Medical History:  Diagnosis Date  . Depression     Past Surgical History:  Procedure Laterality Date  . None      Family History  Problem Relation Age of Onset  . Alcohol abuse Mother        recovering alcoholic  . Diabetes Mother   . Colon cancer Paternal Grandmother   . Breast cancer Paternal Grandmother     Social History   Socioeconomic History  . Marital status: Single    Spouse name: Not on file  . Number of children: 0  . Years of education: Not on file  . Highest education level: Not on file  Occupational History  . Occupation: Print production planner  Tobacco Use  . Smoking status: Current Every Day Smoker    Packs/day: 0.50    Years: 6.00    Pack years: 3.00    Types: Cigarettes  . Smokeless tobacco: Never Used  Substance and Sexual Activity  . Alcohol use: No    Comment: one drink every few weeks  . Drug use: Yes    Types: Marijuana    Comment: from time to time  . Sexual activity: Yes    Birth control/protection: Condom  Other Topics Concern  . Not on file  Social History Narrative  . Not on file   Social Determinants of Health   Financial Resource Strain:   . Difficulty of Paying Living Expenses:   Food Insecurity:   . Worried About Programme researcher, broadcasting/film/video in the Last Year:   . Barista in the Last Year:    Transportation Needs:   . Freight forwarder (Medical):   Marland Kitchen Lack of Transportation (Non-Medical):   Physical Activity:   . Days of Exercise per Week:   . Minutes of Exercise per Session:   Stress:   . Feeling of Stress :   Social Connections:   . Frequency of Communication with Friends and Family:   . Frequency of Social Gatherings with Friends and Family:   . Attends Religious Services:   . Active Member of Clubs or Organizations:   . Attends Banker Meetings:   Marland Kitchen Marital Status:   Intimate Partner Violence:   . Fear of Current or Ex-Partner:   . Emotionally Abused:   Marland Kitchen Physically Abused:   . Sexually Abused:     Outpatient Medications Prior to Visit  Medication Sig Dispense Refill  . norethindrone (MICRONOR) 0.35 MG tablet TAKE 1 TABLET BY MOUTH EVERY DAY 84 tablet 3   No facility-administered medications prior to visit.    Allergies  Allergen Reactions  . Penicillins Other (See Comments)    Never has taken but 2 sisters with reactions    ROS Review of Systems   A fourteen system review of  systems was performed and found to be positive as per HPI.   Objective:    Physical Exam General: Well nourished, in no apparent distress. Eyes: PERRLA, EOMs, conjunctiva clr Resp: Respiratory effort- normal, ECTA B/L w/o W/R/R  Cardio: RRR w/o MRGs. Abdomen: no gross distention. Genitourinary: No lesions, erythema or tenderness in the vagina. Moderate amount of bleeding from menses present. No adnexal tenderness bilaterally.      Lymphatics:  less 2 sec cap RF M-sk: Full ROM, 5/5 strength, normal gait.  Skin: Warm, dry  Neuro: Alert, Oriented Psych: Normal affect, Insight and Judgment appropriate.    BP 122/75   Pulse 80   Temp 98.4 F (36.9 C) (Oral)   Ht 5' 6.5" (1.689 m)   Wt 141 lb 14.4 oz (64.4 kg)   LMP 09/20/2019 (Exact Date)   SpO2 98% Comment: on RA  BMI 22.56 kg/m  Wt Readings from Last 3 Encounters:  09/23/19 141 lb 14.4 oz (64.4  kg)  10/30/18 147 lb 4.8 oz (66.8 kg)  10/30/17 141 lb 3.2 oz (64 kg)     Health Maintenance Due  Topic Date Due  . PAP-Cervical Cytology Screening  06/13/2019  . PAP SMEAR-Modifier  06/13/2019    There are no preventive care reminders to display for this patient.  Lab Results  Component Value Date   TSH 1.020 10/30/2018   Lab Results  Component Value Date   WBC 4.5 10/30/2018   HGB 13.5 10/30/2018   HCT 39.9 10/30/2018   MCV 87 10/30/2018   PLT 249 10/30/2018   Lab Results  Component Value Date   NA 140 10/30/2018   K 4.9 10/30/2018   CO2 23 10/30/2018   GLUCOSE 62 (L) 10/30/2018   BUN 13 10/30/2018   CREATININE 0.73 10/30/2018   BILITOT 0.4 10/30/2018   ALKPHOS 52 10/30/2018   AST 18 10/30/2018   ALT 10 10/30/2018   PROT 6.5 10/30/2018   ALBUMIN 4.5 10/30/2018   CALCIUM 9.9 10/30/2018   Lab Results  Component Value Date   CHOL 154 05/01/2017   Lab Results  Component Value Date   HDL 55 05/01/2017   Lab Results  Component Value Date   LDLCALC 88 05/01/2017   Lab Results  Component Value Date   TRIG 54 05/01/2017   Lab Results  Component Value Date   CHOLHDL 2.8 05/01/2017   Lab Results  Component Value Date   HGBA1C 5.1 10/30/2018      Assessment & Plan:   Problem List Items Addressed This Visit      Other   Screening for cervical cancer - Primary   Relevant Orders   Cytology - PAP( [facility])   Surveillance for birth control, oral contraceptives   Relevant Medications   norethindrone (MICRONOR) 0.35 MG tablet      Meds ordered this encounter  Medications  . norethindrone (MICRONOR) 0.35 MG tablet    Sig: Take 1 tablet (0.35 mg total) by mouth daily.    Dispense:  30 tablet    Refill:  11    Order Specific Question:   Supervising Provider    Answer:   [place] Maintenance, Surveillance for birth control: - OCP refilled for one year. - Continue tobacco cessation goal. - Continue good  hydration.  - Follow Mediterranean diet and stay as active as possible.  Follow-up: Return in about 1 year (around 09/22/2020) for CPE .    Lorrene Reid, PA-C

## 2019-09-25 LAB — CYTOLOGY - PAP

## 2019-09-26 ENCOUNTER — Other Ambulatory Visit: Payer: Self-pay | Admitting: Physician Assistant

## 2019-09-26 DIAGNOSIS — R87612 Low grade squamous intraepithelial lesion on cytologic smear of cervix (LGSIL): Secondary | ICD-10-CM

## 2019-09-30 ENCOUNTER — Other Ambulatory Visit: Payer: Self-pay

## 2019-10-01 ENCOUNTER — Encounter: Payer: Self-pay | Admitting: Obstetrics and Gynecology

## 2019-10-01 ENCOUNTER — Ambulatory Visit (INDEPENDENT_AMBULATORY_CARE_PROVIDER_SITE_OTHER): Payer: 59 | Admitting: Obstetrics and Gynecology

## 2019-10-01 VITALS — BP 106/60 | HR 77 | Temp 98.7°F | Ht 66.0 in | Wt 131.0 lb

## 2019-10-01 DIAGNOSIS — Z7189 Other specified counseling: Secondary | ICD-10-CM

## 2019-10-01 DIAGNOSIS — R87612 Low grade squamous intraepithelial lesion on cytologic smear of cervix (LGSIL): Secondary | ICD-10-CM

## 2019-10-01 DIAGNOSIS — Z23 Encounter for immunization: Secondary | ICD-10-CM

## 2019-10-01 DIAGNOSIS — Z7185 Encounter for immunization safety counseling: Secondary | ICD-10-CM

## 2019-10-01 NOTE — Patient Instructions (Signed)

## 2019-10-01 NOTE — Progress Notes (Signed)
24 y.o. G0P0000 Married White or Caucasian Not Hispanic or Latino female here  abnormal pap.  Pap from 09/23/19 returned with LSIL. No prior abnormal paps. Prior pap was on 06/12/16 and was negative.   Patient's last menstrual period was 09/20/2019 (exact date).          Sexually active: Yes.    The current method of family planning is OCP (estrogen/progesterone).    Exercising: Yes.    walking  Smoker:  yes  Health Maintenance: Pap:  09/23/19 LSIL  History of abnormal Pap:  yes TDaP:  2007 Gardasil: no   reports that she has been smoking cigarettes. She has a 3.00 pack-year smoking history. She has never used smokeless tobacco. She reports current drug use. Drug: Marijuana. She reports that she does not drink alcohol.  Past Medical History:  Diagnosis Date  . Depression     Past Surgical History:  Procedure Laterality Date  . None      Current Outpatient Medications  Medication Sig Dispense Refill  . norethindrone (MICRONOR) 0.35 MG tablet Take 1 tablet (0.35 mg total) by mouth daily. 30 tablet 11   No current facility-administered medications for this visit.    Family History  Problem Relation Age of Onset  . Alcohol abuse Mother        recovering alcoholic  . Diabetes Mother   . Colon cancer Paternal Grandmother   . Breast cancer Paternal Grandmother     Review of Systems  All other systems reviewed and are negative.   Exam:   BP 106/60   Pulse 77   Temp 98.7 F (37.1 C)   Ht 5\' 6"  (1.676 m)   Wt 131 lb (59.4 kg)   LMP 09/20/2019 (Exact Date)   SpO2 100%   BMI 21.14 kg/m   Weight change: @WEIGHTCHANGE @ Height:   Height: 5\' 6"  (167.6 cm)  Ht Readings from Last 3 Encounters:  10/01/19 5\' 6"  (1.676 m)  09/23/19 5' 6.5" (1.689 m)  10/30/18 5' 6.5" (1.689 m)    General appearance: alert, cooperative and appears stated age   Pelvic: External genitalia:  no lesions              Urethra:  normal appearing urethra with no masses, tenderness or lesions               Bartholins and Skenes: normal                 Vagina: normal appearing vagina with normal color and discharge, no lesions              Cervix: no gross lesions   Colposcopy: satisfactory, aceto-white changes anteriorly. Biopsies taken at 10, 11 and 1 o'clock, ECC done. Silver nitrate used for hemostasis.                 11/23/19 chaperoned for the exam.  A:  LSIL pap  P:   Colposcopy with biopsies and ECC  Further plans depending on results  CC 09-05-1997, PA-C Note sent

## 2019-10-02 ENCOUNTER — Other Ambulatory Visit (HOSPITAL_COMMUNITY)
Admission: RE | Admit: 2019-10-02 | Discharge: 2019-10-02 | Disposition: A | Discharge status: Home / Self Care | Payer: 59 | Source: Ambulatory Visit | Attending: Obstetrics and Gynecology | Admitting: Obstetrics and Gynecology

## 2019-10-02 DIAGNOSIS — R87612 Low grade squamous intraepithelial lesion on cytologic smear of cervix (LGSIL): Secondary | ICD-10-CM | POA: Diagnosis present

## 2019-10-06 ENCOUNTER — Telehealth: Payer: Self-pay | Admitting: *Deleted

## 2019-10-06 DIAGNOSIS — N871 Moderate cervical dysplasia: Secondary | ICD-10-CM

## 2019-10-06 DIAGNOSIS — D069 Carcinoma in situ of cervix, unspecified: Secondary | ICD-10-CM

## 2019-10-06 LAB — SURGICAL PATHOLOGY

## 2019-10-06 NOTE — Telephone Encounter (Signed)
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 Please explain and schedule.  CC: Mayer Masker, PA-C

## 2019-10-06 NOTE — Telephone Encounter (Signed)
Leda Min, RN  10/06/2019 4:32 PM EDT Back to Top    Spoke with patient. Advised of results as seen below per Dr. Oscar La. Brief explanation of LEEP procedure provided. LMP 09/20/19. OCP for contraceptive. Patient request to review benefits prior to scheduling LEEP. Advised patient I will place order and notify business office for return call. Patient verbalizes understanding and is agreeable.   See 10/06/19 telephone encounter   Routing to [facility] and [employer] Pilon for review of benefits  1 mo recall placed.

## 2019-10-08 NOTE — Telephone Encounter (Signed)
Call placed to convey benefits. Spoke with the patient and conveyed the benefits. Patient understands/agreeable with the benefits. Patient is closing on her house today and requests to call back next week to schedule the appointment.

## 2019-10-22 NOTE — Telephone Encounter (Signed)
Patient is calling to schedule LEEP procedure

## 2019-10-22 NOTE — Telephone Encounter (Signed)
Spoke with patient. Request to proceed with scheduling LEEP procedure for the end of July or first week of August. LMP 10/19/19. POP for contraceptive. Offered LEEP for 10/28/19, patient declined.   LEEP scheduled for 11/25/19 at 4pm with Dr. Oscar La. Advised patient to continue POP, return call to office to notify if any concern for pregnancy prior to procedure. Patient verbalizes understanding and is agreeable.   Routing to provider for final review. Patient is agreeable to disposition. Will close encounter.  Cc: Soundra Pilon

## 2019-11-25 ENCOUNTER — Ambulatory Visit (INDEPENDENT_AMBULATORY_CARE_PROVIDER_SITE_OTHER): Payer: 59 | Admitting: Obstetrics and Gynecology

## 2019-11-25 ENCOUNTER — Encounter: Payer: Self-pay | Admitting: Obstetrics and Gynecology

## 2019-11-25 ENCOUNTER — Other Ambulatory Visit: Payer: Self-pay

## 2019-11-25 VITALS — BP 110/60 | HR 65 | Ht 66.0 in | Wt 143.0 lb

## 2019-11-25 DIAGNOSIS — D069 Carcinoma in situ of cervix, unspecified: Secondary | ICD-10-CM

## 2019-11-25 DIAGNOSIS — N871 Moderate cervical dysplasia: Secondary | ICD-10-CM

## 2019-11-25 NOTE — Patient Instructions (Signed)

## 2019-11-25 NOTE — Progress Notes (Signed)
GYNECOLOGY  VISIT   HPI: 24 y.o.   Married White or Caucasian Not Hispanic or Latino  female   G0P0000 with No LMP recorded.   here for Leep procedure. She has not been sexually active.    GYNECOLOGIC HISTORY: No LMP recorded. Contraception:Micronor Menopausal hormone therapy: none         OB History    Gravida  0   Para  0   Term  0   Preterm  0   AB  0   Living  0     SAB  0   TAB  0   Ectopic  0   Multiple  0   Live Births  0              Patient Active Problem List   Diagnosis Date Noted  . Encounter for screening examination for chlamydial infection 10/30/2017  . Vitamin D deficiency 05/01/2017  . Healthcare maintenance 05/01/2017  . Screening for cervical cancer 06/12/2016  . Surveillance for birth control, oral contraceptives 06/12/2016  . Epigastric pain 06/07/2016  . Family history of diabetes mellitus 06/07/2016  . Routine physical examination 06/07/2016  . Encounter for vitamin deficiency screening 06/07/2016  . Fatigue 06/07/2016  . Tobacco use 06/07/2016    Past Medical History:  Diagnosis Date  . Depression     Past Surgical History:  Procedure Laterality Date  . None      Current Outpatient Medications  Medication Sig Dispense Refill  . norethindrone (MICRONOR) 0.35 MG tablet Take 1 tablet (0.35 mg total) by mouth daily. 30 tablet 11   No current facility-administered medications for this visit.     ALLERGIES: Penicillins  Family History  Problem Relation Age of Onset  . Alcohol abuse Mother        recovering alcoholic  . Diabetes Mother   . Colon cancer Paternal Grandmother   . Breast cancer Paternal Grandmother     Social History   Socioeconomic History  . Marital status: Married    Spouse name: Not on file  . Number of children: 0  . Years of education: Not on file  . Highest education level: Not on file  Occupational History  . Occupation: Print production planner  Tobacco Use  . Smoking status: Current Every Day  Smoker    Packs/day: 0.50    Years: 6.00    Pack years: 3.00    Types: Cigarettes  . Smokeless tobacco: Never Used  Substance and Sexual Activity  . Alcohol use: No    Comment: one drink every few weeks  . Drug use: Yes    Types: Marijuana    Comment: from time to time  . Sexual activity: Yes    Birth control/protection: Condom, Pill  Other Topics Concern  . Not on file  Social History Narrative  . Not on file   Social Determinants of Health   Financial Resource Strain:   . Difficulty of Paying Living Expenses:   Food Insecurity:   . Worried About Programme researcher, broadcasting/film/video in the Last Year:   . Barista in the Last Year:   Transportation Needs:   . Freight forwarder (Medical):   Marland Kitchen Lack of Transportation (Non-Medical):   Physical Activity:   . Days of Exercise per Week:   . Minutes of Exercise per Session:   Stress:   . Feeling of Stress :   Social Connections:   . Frequency of Communication with Friends and Family:   .  Frequency of Social Gatherings with Friends and Family:   . Attends Religious Services:   . Active Member of Clubs or Organizations:   . Attends Banker Meetings:   Marland Kitchen Marital Status:   Intimate Partner Violence:   . Fear of Current or Ex-Partner:   . Emotionally Abused:   Marland Kitchen Physically Abused:   . Sexually Abused:     Review of Systems  All other systems reviewed and are negative.   PHYSICAL EXAMINATION:    There were no vitals taken for this visit.    General appearance: alert, cooperative and appears stated age  Pelvic: External genitalia:  no lesions              Urethra:  normal appearing urethra with no masses, tenderness or lesions              Bartholins and Skenes: normal                 Vagina: normal appearing vagina with normal color and discharge, no lesions              Cervix: no lesions  Procedure: The patient was counseled as to the risks of the procedure, including: infection, bleeding, future pregnancy  risks and cervical stenosis. A consent form was signed. Acetic acid was placed on the cervix, colposcopy is satisfactory with a wide TZ. Under colposcopic guidance, Lugols solution was placed on the cervix and a paracervical block was injected using 1% lidocaine with epinephrine. Under colposcopic guidance, the 2 x 0.8  cm loop was used to remove a portion of the ectocervix taking care to get the entire transformation zone. 2 passes were needed. The lower pass was the larger specimen with the majority of the Tzone, the upper pass got the remainder of the [place].  An ECC was performed. The cautery ball was then used to cauterize the base of the biopsy site and monsels were placed. I cauterized beyond the edge of the base.  The patient tolerated the procedure well.    Chaperone was present for exam.  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     PLAN Leep performed

## 2019-11-26 ENCOUNTER — Other Ambulatory Visit (HOSPITAL_COMMUNITY)
Admission: RE | Admit: 2019-11-26 | Discharge: 2019-11-26 | Disposition: A | Discharge status: Home / Self Care | Payer: 59 | Source: Ambulatory Visit | Attending: Obstetrics and Gynecology | Admitting: Obstetrics and Gynecology

## 2019-11-26 DIAGNOSIS — D069 Carcinoma in situ of cervix, unspecified: Secondary | ICD-10-CM | POA: Insufficient documentation

## 2019-11-28 LAB — SURGICAL PATHOLOGY

## 2019-12-01 ENCOUNTER — Telehealth: Payer: Self-pay

## 2019-12-01 NOTE — Telephone Encounter (Signed)
Spoke with pt. Pt given results and recommendations per Dr Oscar La. Pt agreeable and verbalized understanding.Pt scheduled for 6 month follow up on 05/31/20 at 4 pm. Pt agreeable. Pt has mychart reminder set.  Pt has 1 month LEEP follow up on 12/31/19.  Encounter closed.

## 2019-12-01 NOTE — Telephone Encounter (Signed)
-----   Message from Romualdo Bolk, MD sent at 11/30/2019  1:56 PM EDT ----- Please let the patient know that the pathology returned with CIN III. Ectocervical margin was +, but I believe this was because the specimen was removed in 2 pieces. The endocervical margin was negative. The ECC was insufficient, but her last ECC was negative and with the endocervical margin being negative, I'm not concerned. She needs f/u hpv testing in 6 months.

## 2019-12-02 ENCOUNTER — Ambulatory Visit: Payer: 59

## 2019-12-24 NOTE — Progress Notes (Signed)
GYNECOLOGY  VISIT   HPI: 24 y.o.   Married White or Caucasian Not Hispanic or Latino  female   G0P0000 with No LMP recorded.   here for 1 month Leep follow up. Pathology with CIN 3, ectocervical margin was +, but removed in 2 pieces. Endocervical margin was negative, ECC was insufficient, but ECC at colposcopy was negative.  She says that she had some discharge about 3 days after her leep. But otherwise has been fine.   GYNECOLOGIC HISTORY: No LMP recorded. Contraception:OCP Menopausal hormone therapy: none         OB History    Gravida  0   Para  0   Term  0   Preterm  0   AB  0   Living  0     SAB  0   TAB  0   Ectopic  0   Multiple  0   Live Births  0              Patient Active Problem List   Diagnosis Date Noted  . Encounter for screening examination for chlamydial infection 10/30/2017  . Vitamin D deficiency 05/01/2017  . Healthcare maintenance 05/01/2017  . Screening for cervical cancer 06/12/2016  . Surveillance for birth control, oral contraceptives 06/12/2016  . Epigastric pain 06/07/2016  . Family history of diabetes mellitus 06/07/2016  . Routine physical examination 06/07/2016  . Encounter for vitamin deficiency screening 06/07/2016  . Fatigue 06/07/2016  . Tobacco use 06/07/2016    Past Medical History:  Diagnosis Date  . Depression     Past Surgical History:  Procedure Laterality Date  . None      Current Outpatient Medications  Medication Sig Dispense Refill  . norethindrone (MICRONOR) 0.35 MG tablet Take 1 tablet (0.35 mg total) by mouth daily. 30 tablet 11   No current facility-administered medications for this visit.     ALLERGIES: Penicillins  Family History  Problem Relation Age of Onset  . Alcohol abuse Mother        recovering alcoholic  . Diabetes Mother   . Colon cancer Paternal Grandmother   . Breast cancer Paternal Grandmother     Social History   Socioeconomic History  . Marital status: Married     Spouse name: Not on file  . Number of children: 0  . Years of education: Not on file  . Highest education level: Not on file  Occupational History  . Occupation: Print production planner  Tobacco Use  . Smoking status: Current Every Day Smoker    Packs/day: 0.50    Years: 6.00    Pack years: 3.00    Types: Cigarettes  . Smokeless tobacco: Never Used  Substance and Sexual Activity  . Alcohol use: No    Comment: one drink every few weeks  . Drug use: Yes    Types: Marijuana    Comment: from time to time  . Sexual activity: Yes    Birth control/protection: Condom, Pill  Other Topics Concern  . Not on file  Social History Narrative  . Not on file   Social Determinants of Health   Financial Resource Strain:   . Difficulty of Paying Living Expenses: Not on file  Food Insecurity:   . Worried About Programme researcher, broadcasting/film/video in the Last Year: Not on file  . Ran Out of Food in the Last Year: Not on file  Transportation Needs:   . Lack of Transportation (Medical): Not on file  . Lack  of Transportation (Non-Medical): Not on file  Physical Activity:   . Days of Exercise per Week: Not on file  . Minutes of Exercise per Session: Not on file  Stress:   . Feeling of Stress : Not on file  Social Connections:   . Frequency of Communication with Friends and Family: Not on file  . Frequency of Social Gatherings with Friends and Family: Not on file  . Attends Religious Services: Not on file  . Active Member of Clubs or Organizations: Not on file  . Attends Banker Meetings: Not on file  . Marital Status: Not on file  Intimate Partner Violence:   . Fear of Current or Ex-Partner: Not on file  . Emotionally Abused: Not on file  . Physically Abused: Not on file  . Sexually Abused: Not on file    Review of Systems  All other systems reviewed and are negative.   PHYSICAL EXAMINATION:    BP 110/60   Pulse 76   Ht 5\' 6"  (1.676 m)   Wt 143 lb (64.9 kg)   SpO2 99%   BMI 23.08 kg/m      General appearance: alert, cooperative and appears stated age  Pelvic: External genitalia:  no lesions              Urethra:  normal appearing urethra with no masses, tenderness or lesions              Bartholins and Skenes: normal                 Vagina: normal appearing vagina with normal color and discharge, no lesions              Cervix: no lesions and +ectropion, healing well.               Bimanual Exam:  Uterus:  normal size, contour, position, consistency, mobility, non-tender              Adnexa: no mass, fullness, tenderness               Chaperone was present for exam.  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 Healing well.    PLAN Needs f/u pap in 2/22 (already scheduled)

## 2019-12-31 ENCOUNTER — Ambulatory Visit: Payer: 59 | Admitting: Obstetrics and Gynecology

## 2019-12-31 ENCOUNTER — Other Ambulatory Visit: Payer: Self-pay

## 2019-12-31 ENCOUNTER — Encounter: Payer: Self-pay | Admitting: Obstetrics and Gynecology

## 2019-12-31 VITALS — BP 110/60 | HR 76 | Ht 66.0 in | Wt 143.0 lb

## 2019-12-31 DIAGNOSIS — Z9889 Other specified postprocedural states: Secondary | ICD-10-CM

## 2020-03-24 ENCOUNTER — Other Ambulatory Visit: Payer: Self-pay

## 2020-03-24 ENCOUNTER — Ambulatory Visit (INDEPENDENT_AMBULATORY_CARE_PROVIDER_SITE_OTHER): Payer: 59

## 2020-03-24 VITALS — BP 110/60 | HR 76 | Resp 14 | Ht 66.0 in | Wt 143.0 lb

## 2020-03-24 DIAGNOSIS — Z23 Encounter for immunization: Secondary | ICD-10-CM | POA: Diagnosis not present

## 2020-03-24 DIAGNOSIS — Z7185 Encounter for immunization safety counseling: Secondary | ICD-10-CM

## 2020-03-24 NOTE — Progress Notes (Signed)
Patient in today for 3rd Gardasil injection.   Contraception: Micronor  LMP: not sure exact date  Last OV: 12/31/19 with Jertson   Injection given in left deltoid. Patient tolerated shot well.   Patient informed injections complete   Advised patient, if not on birth control, to return for next injection with cycle.   Routed to provider for final review.  Encounter closed.

## 2020-05-31 ENCOUNTER — Encounter: Payer: Self-pay | Admitting: Obstetrics and Gynecology

## 2020-05-31 ENCOUNTER — Other Ambulatory Visit (HOSPITAL_COMMUNITY)
Admission: RE | Admit: 2020-05-31 | Discharge: 2020-05-31 | Disposition: A | Discharge status: Home / Self Care | Payer: 59 | Source: Ambulatory Visit | Attending: Obstetrics and Gynecology | Admitting: Obstetrics and Gynecology

## 2020-05-31 ENCOUNTER — Ambulatory Visit: Payer: 59 | Admitting: Obstetrics and Gynecology

## 2020-05-31 ENCOUNTER — Other Ambulatory Visit: Payer: Self-pay

## 2020-05-31 VITALS — BP 110/66 | Ht 66.0 in | Wt 141.6 lb

## 2020-05-31 DIAGNOSIS — Z9889 Other specified postprocedural states: Secondary | ICD-10-CM | POA: Insufficient documentation

## 2020-05-31 DIAGNOSIS — Z3009 Encounter for other general counseling and advice on contraception: Secondary | ICD-10-CM | POA: Diagnosis not present

## 2020-05-31 DIAGNOSIS — Z124 Encounter for screening for malignant neoplasm of cervix: Secondary | ICD-10-CM | POA: Diagnosis not present

## 2020-05-31 NOTE — Progress Notes (Signed)
GYNECOLOGY  VISIT   HPI: 25 y.o.   Married White or Caucasian Not Hispanic or Latino  female   G0P0000 with Patient's last menstrual period was 05/09/2020 (exact date).   here for   6 month pap smear The patient had a LEEP in 9/21. Pathology with CIN 3, ectocervical margin was + but removed in 2 pieces. Endocervical margin was negative. ECC was insufficient, but the ECC at colposcopy was negative.   She is interested in other forms of contraception. C/O low libido, doesn't want to take a pill every day.   GYNECOLOGIC HISTORY: Patient's last menstrual period was 05/09/2020 (exact date). Contraception: ocp Menopausal hormone therapy: none        OB History    Gravida  0   Para  0   Term  0   Preterm  0   AB  0   Living  0     SAB  0   IAB  0   Ectopic  0   Multiple  0   Live Births  0              Patient Active Problem List   Diagnosis Date Noted  . Encounter for screening examination for chlamydial infection 10/30/2017  . Vitamin D deficiency 05/01/2017  . Healthcare maintenance 05/01/2017  . Screening for cervical cancer 06/12/2016  . Surveillance for birth control, oral contraceptives 06/12/2016  . Epigastric pain 06/07/2016  . Family history of diabetes mellitus 06/07/2016  . Routine physical examination 06/07/2016  . Encounter for vitamin deficiency screening 06/07/2016  . Fatigue 06/07/2016  . Tobacco use 06/07/2016    Past Medical History:  Diagnosis Date  . Depression     Past Surgical History:  Procedure Laterality Date  . None      Current Outpatient Medications  Medication Sig Dispense Refill  . norethindrone (MICRONOR) 0.35 MG tablet Take 1 tablet (0.35 mg total) by mouth daily. 30 tablet 11   No current facility-administered medications for this visit.     ALLERGIES: Penicillins  Family History  Problem Relation Age of Onset  . Alcohol abuse Mother        recovering alcoholic  . Diabetes Mother   . Colon cancer Paternal  Grandmother   . Breast cancer Paternal Grandmother     Social History   Socioeconomic History  . Marital status: Married    Spouse name: Not on file  . Number of children: 0  . Years of education: Not on file  . Highest education level: Not on file  Occupational History  . Occupation: Print production planner  Tobacco Use  . Smoking status: Current Every Day Smoker    Packs/day: 0.50    Years: 6.00    Pack years: 3.00    Types: Cigarettes  . Smokeless tobacco: Never Used  Vaping Use  . Vaping Use: Never used  Substance and Sexual Activity  . Alcohol use: No    Comment: one drink every few weeks  . Drug use: Yes    Types: Marijuana    Comment: from time to time  . Sexual activity: Yes    Birth control/protection: Pill  Other Topics Concern  . Not on file  Social History Narrative  . Not on file   Social Determinants of Health   Financial Resource Strain: Not on file  Food Insecurity: Not on file  Transportation Needs: Not on file  Physical Activity: Not on file  Stress: Not on file  Social Connections: Not  on file  Intimate Partner Violence: Not on file    ROS  PHYSICAL EXAMINATION:    Ht 5\' 6"  (1.676 m)   Wt 141 lb 9.6 oz (64.2 kg)   LMP 05/09/2020 (Exact Date)   BMI 22.85 kg/m     General appearance: alert, cooperative and appears stated age   Pelvic: External genitalia:  no lesions              Urethra:  normal appearing urethra with no masses, tenderness or lesions              Bartholins and Skenes: normal                 Vagina: normal appearing vagina with normal color and discharge, no lesions              Cervix: no lesions                Chaperone was present for exam.  1. History of loop electrical excision procedure (LEEP)   2. Screening for cervical cancer Pap with hpv  3. General counseling and advice on female contraception Discussed options of different IUD's. She is interested in the mirena IUD. Risks and side effects reviewed,  information given.  - IUD Insertion; Future

## 2020-06-02 LAB — CYTOLOGY - PAP
Comment: NEGATIVE
Diagnosis: UNDETERMINED — AB
High risk HPV: NEGATIVE

## 2020-06-09 ENCOUNTER — Ambulatory Visit (INDEPENDENT_AMBULATORY_CARE_PROVIDER_SITE_OTHER): Payer: 59 | Admitting: Obstetrics and Gynecology

## 2020-06-09 ENCOUNTER — Other Ambulatory Visit: Payer: Self-pay

## 2020-06-09 ENCOUNTER — Encounter: Payer: Self-pay | Admitting: Obstetrics and Gynecology

## 2020-06-09 VITALS — BP 110/64 | HR 66 | Ht 66.0 in | Wt 141.0 lb

## 2020-06-09 DIAGNOSIS — Z30430 Encounter for insertion of intrauterine contraceptive device: Secondary | ICD-10-CM | POA: Diagnosis not present

## 2020-06-09 DIAGNOSIS — Z3009 Encounter for other general counseling and advice on contraception: Secondary | ICD-10-CM

## 2020-06-09 DIAGNOSIS — Z01812 Encounter for preprocedural laboratory examination: Secondary | ICD-10-CM

## 2020-06-09 LAB — PREGNANCY, URINE: Preg Test, Ur: NEGATIVE

## 2020-06-09 NOTE — Patient Instructions (Signed)

## 2020-06-09 NOTE — Progress Notes (Signed)
GYNECOLOGY  VISIT   HPI: 25 y.o.   Married White or Caucasian Not Hispanic or Latino  female   G0P0000 with No LMP recorded.   here for IUD insertion  GYNECOLOGIC HISTORY: No LMP recorded. Contraception: OCP  Menopausal hormone therapy: none         OB History    Gravida  0   Para  0   Term  0   Preterm  0   AB  0   Living  0     SAB  0   IAB  0   Ectopic  0   Multiple  0   Live Births  0              Patient Active Problem List   Diagnosis Date Noted  . Encounter for screening examination for chlamydial infection 10/30/2017  . Vitamin D deficiency 05/01/2017  . Healthcare maintenance 05/01/2017  . Screening for cervical cancer 06/12/2016  . Surveillance for birth control, oral contraceptives 06/12/2016  . Epigastric pain 06/07/2016  . Family history of diabetes mellitus 06/07/2016  . Routine physical examination 06/07/2016  . Encounter for vitamin deficiency screening 06/07/2016  . Fatigue 06/07/2016  . Tobacco use 06/07/2016    Past Medical History:  Diagnosis Date  . Depression     Past Surgical History:  Procedure Laterality Date  . None      Current Outpatient Medications  Medication Sig Dispense Refill  . norethindrone (MICRONOR) 0.35 MG tablet Take 1 tablet (0.35 mg total) by mouth daily. 30 tablet 11   No current facility-administered medications for this visit.     ALLERGIES: Penicillins  Family History  Problem Relation Age of Onset  . Alcohol abuse Mother        recovering alcoholic  . Diabetes Mother   . Colon cancer Paternal Grandmother   . Breast cancer Paternal Grandmother     Social History   Socioeconomic History  . Marital status: Married    Spouse name: Not on file  . Number of children: 0  . Years of education: Not on file  . Highest education level: Not on file  Occupational History  . Occupation: Print production planner  Tobacco Use  . Smoking status: Current Every Day Smoker    Packs/day: 0.50    Years:  6.00    Pack years: 3.00    Types: Cigarettes  . Smokeless tobacco: Never Used  Vaping Use  . Vaping Use: Never used  Substance and Sexual Activity  . Alcohol use: No    Comment: one drink every few weeks  . Drug use: Yes    Types: Marijuana    Comment: from time to time  . Sexual activity: Yes    Birth control/protection: Pill  Other Topics Concern  . Not on file  Social History Narrative  . Not on file   Social Determinants of Health   Financial Resource Strain: Not on file  Food Insecurity: Not on file  Transportation Needs: Not on file  Physical Activity: Not on file  Stress: Not on file  Social Connections: Not on file  Intimate Partner Violence: Not on file    ROS  PHYSICAL EXAMINATION:    There were no vitals taken for this visit.    General appearance: alert, cooperative and appears stated age  Pelvic: External genitalia:  no lesions              Urethra:  normal appearing urethra with no masses, tenderness or  lesions              Bartholins and Skenes: normal                 Vagina: normal appearing vagina with normal color and discharge, no lesions              Cervix: no lesions               The risks of the mirena IUD were reviewed with the patient, including infection, abnormal bleeding and uterine perfortion. Consent was signed.  A speculum was placed in the vagina, the cervix was cleansed with betadine. A tenaculum was placed on the cervix, the uterus sounded to 7 cm. The cervix was dilated to a #6 hagar dilator (Couldn't insert IUD after dilation to 5 mm(  The mirena IUD was inserted without difficulty. The string were cut to 3 cm.    The patient tolerated the procedure well.   Chaperone was present for exam.

## 2020-07-07 ENCOUNTER — Ambulatory Visit: Payer: 59 | Admitting: Obstetrics and Gynecology

## 2020-07-08 ENCOUNTER — Ambulatory Visit: Payer: 59 | Admitting: Obstetrics and Gynecology

## 2020-07-08 ENCOUNTER — Encounter: Payer: Self-pay | Admitting: Obstetrics and Gynecology

## 2020-07-08 ENCOUNTER — Other Ambulatory Visit: Payer: Self-pay

## 2020-07-08 VITALS — BP 100/60 | HR 72 | Ht 66.0 in | Wt 140.8 lb

## 2020-07-08 DIAGNOSIS — Z30431 Encounter for routine checking of intrauterine contraceptive device: Secondary | ICD-10-CM | POA: Diagnosis not present

## 2020-07-08 DIAGNOSIS — T8332XA Displacement of intrauterine contraceptive device, initial encounter: Secondary | ICD-10-CM

## 2020-07-08 NOTE — Progress Notes (Signed)
GYNECOLOGY  VISIT   HPI: 25 y.o.   Married White or Caucasian Not Hispanic or Latino  female   G0P0000 with Patient's last menstrual period was 06/08/2020.   here for IUD follow up. She had a mirena IUD placed last month, no real cycle just intermittent light bleeding. No significant cramping. No dyspareunia   GYNECOLOGIC HISTORY: Patient's last menstrual period was 06/08/2020. Contraception: IUD Menopausal hormone therapy: none         OB History    Gravida  0   Para  0   Term  0   Preterm  0   AB  0   Living  0     SAB  0   IAB  0   Ectopic  0   Multiple  0   Live Births  0              Patient Active Problem List   Diagnosis Date Noted  . Encounter for screening examination for chlamydial infection 10/30/2017  . Vitamin D deficiency 05/01/2017  . Healthcare maintenance 05/01/2017  . Screening for cervical cancer 06/12/2016  . Surveillance for birth control, oral contraceptives 06/12/2016  . Epigastric pain 06/07/2016  . Family history of diabetes mellitus 06/07/2016  . Routine physical examination 06/07/2016  . Encounter for vitamin deficiency screening 06/07/2016  . Fatigue 06/07/2016  . Tobacco use 06/07/2016    Past Medical History:  Diagnosis Date  . Depression     Past Surgical History:  Procedure Laterality Date  . None      Current Outpatient Medications  Medication Sig Dispense Refill  . norethindrone (MICRONOR) 0.35 MG tablet Take 1 tablet (0.35 mg total) by mouth daily. 30 tablet 11   No current facility-administered medications for this visit.     ALLERGIES: Penicillins  Family History  Problem Relation Age of Onset  . Alcohol abuse Mother        recovering alcoholic  . Diabetes Mother   . Colon cancer Paternal Grandmother   . Breast cancer Paternal Grandmother     Social History   Socioeconomic History  . Marital status: Married    Spouse name: Not on file  . Number of children: 0  . Years of education: Not on  file  . Highest education level: Not on file  Occupational History  . Occupation: Print production planner  Tobacco Use  . Smoking status: Current Every Day Smoker    Packs/day: 0.50    Years: 6.00    Pack years: 3.00    Types: Cigarettes  . Smokeless tobacco: Never Used  Vaping Use  . Vaping Use: Never used  Substance and Sexual Activity  . Alcohol use: No    Comment: one drink every few weeks  . Drug use: Yes    Types: Marijuana    Comment: from time to time  . Sexual activity: Yes    Birth control/protection: Pill  Other Topics Concern  . Not on file  Social History Narrative  . Not on file   Social Determinants of Health   Financial Resource Strain: Not on file  Food Insecurity: Not on file  Transportation Needs: Not on file  Physical Activity: Not on file  Stress: Not on file  Social Connections: Not on file  Intimate Partner Violence: Not on file    Review of Systems  All other systems reviewed and are negative.   PHYSICAL EXAMINATION:    BP 100/60   Pulse 72   Ht 5\' 6"  (  1.676 m)   Wt 140 lb 12.8 oz (63.9 kg)   LMP 06/08/2020   SpO2 99%   BMI 22.73 kg/m     General appearance: alert, cooperative and appears stated age   Pelvic: External genitalia:  no lesions              Urethra:  normal appearing urethra with no masses, tenderness or lesions              Bartholins and Skenes: normal                 Vagina: normal appearing vagina with normal color and discharge, no lesions              Cervix: no cervical motion tenderness, no lesions and tip of iud string seen up inside the cervix, unable to tease it out with a cytobrush              Bimanual Exam:  Uterus:  normal size, contour, position, consistency, mobility, non-tender              Adnexa: no mass, fullness, tenderness                1. IUD check up Feeling fine, strings have pulled up into the cervix - US PELVIS TRANSVAGINAL NON-OB (TV ONLY); Future  2. Intrauterine contraceptive device threads  lost, initial encounter - US PELVIS TRANSVAGINAL NON-OB (TV ONLY); Future

## 2020-07-15 ENCOUNTER — Encounter: Payer: Self-pay | Admitting: Obstetrics and Gynecology

## 2020-07-15 ENCOUNTER — Other Ambulatory Visit: Payer: Self-pay

## 2020-07-15 ENCOUNTER — Ambulatory Visit: Payer: 59 | Admitting: Obstetrics and Gynecology

## 2020-07-15 ENCOUNTER — Ambulatory Visit (INDEPENDENT_AMBULATORY_CARE_PROVIDER_SITE_OTHER): Payer: 59

## 2020-07-15 VITALS — BP 122/68 | HR 75 | Ht 67.0 in | Wt 140.0 lb

## 2020-07-15 DIAGNOSIS — Z30431 Encounter for routine checking of intrauterine contraceptive device: Secondary | ICD-10-CM

## 2020-07-15 DIAGNOSIS — T8332XD Displacement of intrauterine contraceptive device, subsequent encounter: Secondary | ICD-10-CM

## 2020-07-15 DIAGNOSIS — T8332XA Displacement of intrauterine contraceptive device, initial encounter: Secondary | ICD-10-CM | POA: Diagnosis not present

## 2020-07-15 NOTE — Progress Notes (Signed)
GYNECOLOGY  VISIT   HPI: 25 y.o.   Married White or Caucasian Not Hispanic or Latino  female   G0P0000 with No LMP recorded.   here for ultrasound secondary to missing IUD strings. She had a mirena IUD inserted in 2/22.  GYNECOLOGIC HISTORY: No LMP recorded. Contraception:IUD  Menopausal hormone therapy: none        OB History    Gravida  0   Para  0   Term  0   Preterm  0   AB  0   Living  0     SAB  0   IAB  0   Ectopic  0   Multiple  0   Live Births  0              Patient Active Problem List   Diagnosis Date Noted  . Encounter for screening examination for chlamydial infection 10/30/2017  . Vitamin D deficiency 05/01/2017  . Healthcare maintenance 05/01/2017  . Screening for cervical cancer 06/12/2016  . Surveillance for birth control, oral contraceptives 06/12/2016  . Epigastric pain 06/07/2016  . Family history of diabetes mellitus 06/07/2016  . Routine physical examination 06/07/2016  . Encounter for vitamin deficiency screening 06/07/2016  . Fatigue 06/07/2016  . Tobacco use 06/07/2016    Past Medical History:  Diagnosis Date  . Depression     Past Surgical History:  Procedure Laterality Date  . None      Current Outpatient Medications  Medication Sig Dispense Refill  . norethindrone (MICRONOR) 0.35 MG tablet Take 1 tablet (0.35 mg total) by mouth daily. 30 tablet 11   No current facility-administered medications for this visit.     ALLERGIES: Penicillins  Family History  Problem Relation Age of Onset  . Alcohol abuse Mother        recovering alcoholic  . Diabetes Mother   . Colon cancer Paternal Grandmother   . Breast cancer Paternal Grandmother     Social History   Socioeconomic History  . Marital status: Married    Spouse name: Not on file  . Number of children: 0  . Years of education: Not on file  . Highest education level: Not on file  Occupational History  . Occupation: Print production planner  Tobacco Use  . Smoking  status: Current Every Day Smoker    Packs/day: 0.50    Years: 6.00    Pack years: 3.00    Types: Cigarettes  . Smokeless tobacco: Never Used  Vaping Use  . Vaping Use: Never used  Substance and Sexual Activity  . Alcohol use: No    Comment: one drink every few weeks  . Drug use: Yes    Types: Marijuana    Comment: from time to time  . Sexual activity: Yes    Birth control/protection: Pill  Other Topics Concern  . Not on file  Social History Narrative  . Not on file   Social Determinants of Health   Financial Resource Strain: Not on file  Food Insecurity: Not on file  Transportation Needs: Not on file  Physical Activity: Not on file  Stress: Not on file  Social Connections: Not on file  Intimate Partner Violence: Not on file    Review of Systems  All other systems reviewed and are negative.   PHYSICAL EXAMINATION:    There were no vitals taken for this visit.    General appearance: alert, cooperative and appears stated age   Pelvic ultrasound  Indications: IUD string missing  Findings:  Uterus 7.92 x 4.44 x 3.78 cm  Endometrium 3 mm. IUD properly in endometrial cavity  Left ovary 2.72 x 1.91 x 1.39 cm  Right ovary 4.53 x 2.98 x 3.53 cm  3.30 x 2.95 cm simple cyst  No free fluid   Impression:  Normal sized anteverted uterus Thin endometrial stripe IUD in proper location Normal adnexa bilaterally Simple 3 cm cyst right ovary  1. Intrauterine contraceptive device threads lost, subsequent encounter IUD in place, routine f/u.

## 2021-06-14 NOTE — Progress Notes (Addendum)
26 y.o. G0P0000 Married White or Caucasian Not Hispanic or Latino female here for annual exam.   ?She has a mirena IUD, inserted in 2/22. Strings missing on f/u, in place on ultrasound in 3/22.  ?Occasional spotting. No dyspareunia. No plans to have children.  ?  ?It's been a stressful year with family issues. She has lost 20 lbs since 9/22.  ? ?No LMP recorded.          ?Sexually active: Yes.    ?The current method of family planning is IUD.   Mirena IUD Inserted 2022  ?Exercising: No.  The patient does not participate in regular exercise at present. ?Smoker:  yes 1/2 pack a day  ? ?Health Maintenance: ?Pap: 05/31/20 ASCUS Hr HPV Neg,   09/23/19 LSIL  ?History of abnormal Pap:  yes H/O LEEP in 9/21, pathology with CIN III ?MMG:  none  ?BMD:   none  ?Colonoscopy: none  ?TDaP:  2007  ?Gardasil: x2 ? ? reports that she has been smoking cigarettes. She has a 3.00 pack-year smoking history. She has never used smokeless tobacco. She reports current drug use. Drug: Marijuana. She reports that she does not drink alcohol. 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  ? ?Past Medical History:  ?Diagnosis Date  ? Depression   ? ? ?Past Surgical History:  ?Procedure Laterality Date  ? None    ? ? ?Current Outpatient Medications  ?Medication Sig Dispense Refill  ? norethindrone (MICRONOR) 0.35 MG tablet Take 1 tablet (0.35 mg total) by mouth daily. 30 tablet 11  ? ?No current facility-administered medications for this visit.  ? ? ?Family History  ?Problem Relation Age of Onset  ? Alcohol abuse Mother   ?     recovering alcoholic  ? Diabetes Mother   ? Colon cancer Paternal Grandmother   ? Breast cancer Paternal Grandmother   ? ? ?Review of Systems  ?All other systems reviewed and are negative. ? ?Exam:   ?There were no vitals taken for this visit.  Weight change: @WEIGHTCHANGE @ Height:      ?Ht Readings from Last 3 Encounters:  ?07/15/20 5\' 7"  (1.702 m)  ?07/08/20 5\' 6"  (1.676 m)  ?06/09/20 5\' 6"  (1.676 m)  ? ? ?General  appearance: alert, cooperative and appears stated age ?Head: Normocephalic, without obvious abnormality, atraumatic ?Neck: no adenopathy, supple, symmetrical, trachea midline and thyroid normal to inspection and palpation ?Lungs: clear to auscultation bilaterally ?Cardiovascular: regular rate and rhythm ?Breasts: normal appearance, no masses or tenderness ?Abdomen: soft, non-tender; non distended,  no masses,  no organomegaly ?Extremities: extremities normal, atraumatic, no cyanosis or edema ?Skin: Skin color, texture, turgor normal. No rashes or lesions ?Lymph nodes: Cervical, supraclavicular, and axillary nodes normal. ?No abnormal inguinal nodes palpated ?Neurologic: Grossly normal ? ? ?Pelvic: External genitalia:  no lesions ?             Urethra:  normal appearing urethra with no masses, tenderness or lesions ?             Bartholins and Skenes: normal    ?             Vagina: normal appearing vagina with normal color and discharge, no lesions ?             Cervix: no lesions and friable , IUD strings not seen ?              ?Bimanual Exam:  Uterus:  normal size, contour, position, consistency, mobility,  non-tender ?             Adnexa: no mass, fullness, tenderness ?              Rectovaginal: Confirms ?              Anus:  normal sphincter tone, no lesions ? ?Carolynn Serve chaperoned for the exam. ? ?1. Well woman exam ?Discussed breast self exam ?Discussed calcium and vit D intake ? ?2. Screening for cervical cancer ?- Cytology - PAP ? ?3. IUD check up ?Strings missing, in place on ultrasound ? ?4. Immunization due ?- Tdap vaccine greater than or equal to 7yo IM ?- HPV 9-valent vaccine,Recombinat ? ?5. Laboratory exam ordered as part of routine general medical examination ?- CBC ?- Comprehensive metabolic panel ?- Lipid panel ? ?6. Skin lesion ?Multiple lesions on her mons, also has a rash, will f/u with Dermatology. Unless Dermatology says otherwise, I would recommend biopsy ? ?7. Vitamin D deficiency ?-  Vitamin D (25 hydroxy) ? ?Addendum: ?8. Vaginal discharge ?- WET PREP FOR TRICH, YEAST, CLUE ? ?9. Yeast vaginitis ?- fluconazole (DIFLUCAN) 150 MG tablet; Take 1 tablet (150 mg total) by mouth once for 1 dose. Take one tablet.  Repeat in 72 hours if symptoms are not completely resolved.  Dispense: 2 tablet; Refill: 0 ? ?

## 2021-06-23 ENCOUNTER — Other Ambulatory Visit (HOSPITAL_COMMUNITY)
Admission: RE | Admit: 2021-06-23 | Discharge: 2021-06-23 | Disposition: A | Discharge status: Home / Self Care | Payer: 59 | Source: Ambulatory Visit | Attending: Obstetrics and Gynecology | Admitting: Obstetrics and Gynecology

## 2021-06-23 ENCOUNTER — Encounter: Payer: Self-pay | Admitting: Obstetrics and Gynecology

## 2021-06-23 ENCOUNTER — Ambulatory Visit (INDEPENDENT_AMBULATORY_CARE_PROVIDER_SITE_OTHER): Payer: 59 | Admitting: Obstetrics and Gynecology

## 2021-06-23 ENCOUNTER — Other Ambulatory Visit: Payer: Self-pay

## 2021-06-23 VITALS — BP 120/72 | HR 72 | Ht 67.0 in | Wt 131.0 lb

## 2021-06-23 DIAGNOSIS — Z124 Encounter for screening for malignant neoplasm of cervix: Secondary | ICD-10-CM

## 2021-06-23 DIAGNOSIS — L989 Disorder of the skin and subcutaneous tissue, unspecified: Secondary | ICD-10-CM

## 2021-06-23 DIAGNOSIS — Z30431 Encounter for routine checking of intrauterine contraceptive device: Secondary | ICD-10-CM | POA: Diagnosis not present

## 2021-06-23 DIAGNOSIS — Z01419 Encounter for gynecological examination (general) (routine) without abnormal findings: Secondary | ICD-10-CM | POA: Diagnosis not present

## 2021-06-23 DIAGNOSIS — Z0000 Encounter for general adult medical examination without abnormal findings: Secondary | ICD-10-CM | POA: Diagnosis not present

## 2021-06-23 DIAGNOSIS — Z23 Encounter for immunization: Secondary | ICD-10-CM

## 2021-06-23 DIAGNOSIS — N898 Other specified noninflammatory disorders of vagina: Secondary | ICD-10-CM

## 2021-06-23 DIAGNOSIS — B3731 Acute candidiasis of vulva and vagina: Secondary | ICD-10-CM

## 2021-06-23 DIAGNOSIS — E559 Vitamin D deficiency, unspecified: Secondary | ICD-10-CM

## 2021-06-23 LAB — COMPREHENSIVE METABOLIC PANEL
AG Ratio: 2 (calc) (ref 1.0–2.5)
ALT: 8 U/L (ref 6–29)
AST: 15 U/L (ref 10–30)
Albumin: 4.5 g/dL (ref 3.6–5.1)
Alkaline phosphatase (APISO): 46 U/L (ref 31–125)
BUN: 14 mg/dL (ref 7–25)
CO2: 27 mmol/L (ref 20–32)
Calcium: 10.1 mg/dL (ref 8.6–10.2)
Chloride: 106 mmol/L (ref 98–110)
Creat: 0.68 mg/dL (ref 0.50–0.96)
Globulin: 2.2 g/dL (calc) (ref 1.9–3.7)
Glucose, Bld: 85 mg/dL (ref 65–99)
Potassium: 4.2 mmol/L (ref 3.5–5.3)
Sodium: 139 mmol/L (ref 135–146)
Total Bilirubin: 0.6 mg/dL (ref 0.2–1.2)
Total Protein: 6.7 g/dL (ref 6.1–8.1)

## 2021-06-23 LAB — LIPID PANEL
Cholesterol: 143 mg/dL (ref <200)
HDL: 50 mg/dL (ref >=50)
LDL Cholesterol (Calc): 74 mg/dL (calc)
Non-HDL Cholesterol (Calc): 93 mg/dL (calc) (ref <130)
Total CHOL/HDL Ratio: 2.9 (calc) (ref <5.0)
Triglycerides: 102 mg/dL (ref <150)

## 2021-06-23 LAB — CBC
HCT: 41.4 % (ref 35.0–45.0)
Hemoglobin: 13.8 g/dL (ref 11.7–15.5)
MCH: 29 pg (ref 27.0–33.0)
MCHC: 33.3 g/dL (ref 32.0–36.0)
MCV: 87 fL (ref 80.0–100.0)
MPV: 10.7 fL (ref 7.5–12.5)
Platelets: 280 10*3/uL (ref 140–400)
RBC: 4.76 10*6/uL (ref 3.80–5.10)
RDW: 12.8 % (ref 11.0–15.0)
WBC: 7.2 10*3/uL (ref 3.8–10.8)

## 2021-06-23 LAB — WET PREP FOR TRICH, YEAST, CLUE

## 2021-06-23 LAB — VITAMIN D 25 HYDROXY (VIT D DEFICIENCY, FRACTURES): Vit D, 25-Hydroxy: 27 ng/mL — ABNORMAL LOW (ref 30–100)

## 2021-06-23 MED ORDER — FLUCONAZOLE 150 MG PO TABS: 150.0000 mg | ORAL_TABLET | Freq: Once | ORAL | 0 refills | Status: AC

## 2021-06-23 NOTE — Addendum Note (Signed)
Addended by: Tobi Bastos on: 06/23/2021 02:45 PM ? ? Modules accepted: Orders ? ?

## 2021-06-23 NOTE — Patient Instructions (Signed)

## 2021-06-27 LAB — CYTOLOGY - PAP
Comment: NEGATIVE
Diagnosis: NEGATIVE
High risk HPV: NEGATIVE

## 2022-07-31 NOTE — Progress Notes (Signed)
27 y.o. G0P0000 Married White or Caucasian Not Hispanic or Latino female here for annual exam.  She has a mirena IUD, inserted in 2/22. Strings missing on f/u, in place on ultrasound in 3/22. No regular cycles, just occasional spotting.  No dyspareunia.     No LMP recorded. (Menstrual status: IUD).          Sexually active: Yes.    The current method of family planning is IUD--Mirena 2022.    Exercising: Yes.     Stretching, light cardio, walking the dog Smoker:  yes, 1/2 a PPD, plans to quit.   Health Maintenance: Pap:  06/23/21 neg,HR HPV neg; 05/31/20 ASCUS: HR HPV neg History of abnormal Pap:  yes. H/O LEEP in 9/21, pathology with CIN III  MMG:  n/a BMD:   n/a Colonoscopy: n/a TDaP:  06/23/21 Gardasil: yes   reports that she has been smoking cigarettes. She has a 3.00 pack-year smoking history. She has never used smokeless tobacco. She reports current drug use. Drug: Marijuana. She reports that she does not drink alcohol. 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   Past Medical History:  Diagnosis Date   Depression     Past Surgical History:  Procedure Laterality Date   None      Current Outpatient Medications  Medication Sig Dispense Refill   levonorgestrel (MIRENA) 20 MCG/DAY IUD 1 each by Intrauterine route once.     No current facility-administered medications for this visit.    Family History  Problem Relation Age of Onset   Alcohol abuse Mother        recovering alcoholic   Diabetes Mother    Colon cancer Paternal Grandmother    Breast cancer Paternal Grandmother     Review of Systems  All other systems reviewed and are negative.   Exam:   BP 108/72 (BP Location: Right Arm, Patient Position: Sitting, Cuff Size: Normal)   Pulse 70   Ht 5\' 6"  (1.676 m)   Wt 132 lb (59.9 kg)   SpO2 97%   BMI 21.31 kg/m   Weight change: @WEIGHTCHANGE @ Height:   Height: 5\' 6"  (167.6 cm)  Ht Readings from Last 3 Encounters:  08/11/22 5\' 6"  (1.676 m)   06/23/21 5\' 7"  (1.702 m)  07/15/20 5\' 7"  (1.702 m)    General appearance: alert, cooperative and appears stated age Head: Normocephalic, without obvious abnormality, atraumatic Neck: no adenopathy, supple, symmetrical, trachea midline and thyroid normal to inspection and palpation Lungs: clear to auscultation bilaterally Cardiovascular: regular rate and rhythm Breasts: normal appearance, no masses or tenderness Abdomen: soft, non-tender; non distended,  no masses,  no organomegaly Extremities: extremities normal, atraumatic, no cyanosis or edema Skin: Skin color, texture, turgor normal. No rashes or lesions Lymph nodes: Cervical, supraclavicular, and axillary nodes normal. No abnormal inguinal nodes palpated Neurologic: Grossly normal   Pelvic: External genitalia:  no lesions              Urethra:  normal appearing urethra with no masses, tenderness or lesions              Bartholins and Skenes: normal                 Vagina: normal appearing vagina with normal color and discharge, no lesions              Cervix: no lesions and IUD strings 1 cm  Bimanual Exam:  Uterus:  normal size, contour, position, consistency, mobility, non-tender              Adnexa: no mass, fullness, tenderness               Rectovaginal: Confirms               Anus:  normal sphincter tone, no lesions  Christie BruinsEmily Greer, CMA chaperoned for the exam.  1. Well woman exam Discussed breast self exam Discussed calcium and vit D intake No screening labs today  2. IUD check up Doing well, strings were missing but have reappeared.  3. Screening for cervical cancer - Cytology - PAP  4. Vitamin D deficiency Not taking any supplements - VITAMIN D 25 Hydroxy (Vit-D Deficiency, Fractures)

## 2022-08-11 ENCOUNTER — Encounter: Payer: Self-pay | Admitting: Obstetrics and Gynecology

## 2022-08-11 ENCOUNTER — Ambulatory Visit (INDEPENDENT_AMBULATORY_CARE_PROVIDER_SITE_OTHER): Payer: 59 | Admitting: Obstetrics and Gynecology

## 2022-08-11 ENCOUNTER — Other Ambulatory Visit (HOSPITAL_COMMUNITY)
Admission: RE | Admit: 2022-08-11 | Discharge: 2022-08-11 | Disposition: A | Discharge status: Home / Self Care | Payer: 59 | Source: Ambulatory Visit | Attending: Obstetrics and Gynecology | Admitting: Obstetrics and Gynecology

## 2022-08-11 VITALS — BP 108/72 | HR 70 | Ht 66.0 in | Wt 132.0 lb

## 2022-08-11 DIAGNOSIS — Z30431 Encounter for routine checking of intrauterine contraceptive device: Secondary | ICD-10-CM

## 2022-08-11 DIAGNOSIS — Z124 Encounter for screening for malignant neoplasm of cervix: Secondary | ICD-10-CM | POA: Insufficient documentation

## 2022-08-11 DIAGNOSIS — E559 Vitamin D deficiency, unspecified: Secondary | ICD-10-CM

## 2022-08-11 DIAGNOSIS — Z01419 Encounter for gynecological examination (general) (routine) without abnormal findings: Secondary | ICD-10-CM

## 2022-08-11 NOTE — Patient Instructions (Signed)

## 2022-08-12 LAB — VITAMIN D 25 HYDROXY (VIT D DEFICIENCY, FRACTURES): Vit D, 25-Hydroxy: 31 ng/mL (ref 30–100)

## 2022-08-17 LAB — CYTOLOGY - PAP
Comment: NEGATIVE
Diagnosis: NEGATIVE
High risk HPV: NEGATIVE

## 2023-08-13 ENCOUNTER — Encounter: Payer: Self-pay | Admitting: Obstetrics and Gynecology

## 2023-08-13 ENCOUNTER — Ambulatory Visit (INDEPENDENT_AMBULATORY_CARE_PROVIDER_SITE_OTHER): Payer: 59 | Admitting: Obstetrics and Gynecology

## 2023-08-13 ENCOUNTER — Other Ambulatory Visit (HOSPITAL_COMMUNITY)
Admission: RE | Admit: 2023-08-13 | Discharge: 2023-08-13 | Disposition: A | Discharge status: Home / Self Care | Source: Ambulatory Visit | Attending: Obstetrics and Gynecology | Admitting: Obstetrics and Gynecology

## 2023-08-13 VITALS — BP 110/62 | HR 83 | Ht 66.0 in | Wt 147.0 lb

## 2023-08-13 DIAGNOSIS — Z1331 Encounter for screening for depression: Secondary | ICD-10-CM

## 2023-08-13 DIAGNOSIS — Z01419 Encounter for gynecological examination (general) (routine) without abnormal findings: Secondary | ICD-10-CM | POA: Diagnosis present

## 2023-08-13 MED ORDER — FLUCONAZOLE 150 MG PO TABS: 150.0000 mg | ORAL_TABLET | Freq: Once | ORAL | 0 refills | Status: AC

## 2023-08-13 NOTE — Progress Notes (Signed)
 28 y.o. y.o. female here for annual exam. No LMP recorded. (Menstrual status: IUD).      She has a mirena  IUD, inserted in 2/22. Strings missing on f/u, in place on ultrasound in 3/22. No regular cycles, just occasional spotting.  No dyspareunia.   Married for 5 years Quit smoking  No LMP recorded. (Menstrual status: IUD).         Occasional spotting. Pleased with IUD Sexually active: Yes.    The current method of family planning is IUD--Mirena  2022.    Exercising: Yes.    Stretching, light cardio, walking the dog Smoker: h/o  Health Maintenance: Pap:  06/23/21 neg,HR HPV neg; 05/31/20 ASCUS: HR HPV neg History of abnormal Pap:  yes. H/O LEEP in 9/21, pathology with CIN III  MMG:  n/a BMD:   n/a Colonoscopy: n/a TDaP:  06/23/21 Gardasil: yes Body mass index is 23.73 kg/m.  Status  Final [99]   PACS Intelerad Image Link   Show images for US  PELVIS TRANSVAGINAL NON-OB (TV ONLY) Study Result  Narrative & Impression  Pelvic ultrasound   Indications: IUD string missing   Findings:   Uterus 7.92 x 4.44 x 3.78 cm   Endometrium 3 mm. IUD properly in endometrial cavity   Left ovary 2.72 x 1.91 x 1.39 cm   Right ovary 4.53 x 2.98 x 3.53 cm             3.30 x 2.95 cm simple cyst   No free fluid     Impression:  Normal sized anteverted uterus Thin endometrial stripe IUD in proper location Normal adnexa bilaterally Simple 3 cm cyst right ovary       08/13/2023    8:02 AM 09/23/2019    2:18 PM 10/30/2018    8:11 AM  Depression screen PHQ 2/9  Decreased Interest 1 0 1  Down, Depressed, Hopeless 1 0 1  PHQ - 2 Score 2 0 2  Altered sleeping 1 0 1  Tired, decreased energy 0 0 1  Change in appetite 0 0 0  Feeling bad or failure about yourself  0 0 0  Trouble concentrating 0 0 0  Moving slowly or fidgety/restless 0 0 0  Suicidal thoughts 0 0 0  PHQ-9 Score 3 0 4  Difficult doing work/chores   Somewhat difficult    Blood pressure 110/62, pulse 83, height 5\' 6"   (1.676 m), weight 147 lb (66.7 kg), SpO2 99%.     Component Value Date/Time   DIAGPAP  08/11/2022 0824    - Negative for intraepithelial lesion or malignancy (NILM)   DIAGPAP  06/23/2021 1351    - Negative for intraepithelial lesion or malignancy (NILM)   DIAGPAP (A) 05/31/2020 1634    - Atypical squamous cells of undetermined significance (ASC-US )   HPVHIGH Negative 08/11/2022 0824   HPVHIGH Negative 06/23/2021 1351   HPVHIGH Negative 05/31/2020 1634   ADEQPAP  08/11/2022 0824    Satisfactory for evaluation; transformation zone component PRESENT.   ADEQPAP  06/23/2021 1351    Satisfactory for evaluation; transformation zone component PRESENT.   ADEQPAP  05/31/2020 1634    Satisfactory for evaluation; transformation zone component PRESENT.    GYN HISTORY:    Component Value Date/Time   DIAGPAP  08/11/2022 0824    - Negative for intraepithelial lesion or malignancy (NILM)   DIAGPAP  06/23/2021 1351    - Negative for intraepithelial lesion or malignancy (NILM)   DIAGPAP (A) 05/31/2020 1634    - Atypical  squamous cells of undetermined significance (ASC-US )   HPVHIGH Negative 08/11/2022 0824   HPVHIGH Negative 06/23/2021 1351   HPVHIGH Negative 05/31/2020 1634   ADEQPAP  08/11/2022 0824    Satisfactory for evaluation; transformation zone component PRESENT.   ADEQPAP  06/23/2021 1351    Satisfactory for evaluation; transformation zone component PRESENT.   ADEQPAP  05/31/2020 1634    Satisfactory for evaluation; transformation zone component PRESENT.    OB History  Gravida Para Term Preterm AB Living  0 0 0 0 0 0  SAB IAB Ectopic Multiple Live Births  0 0 0 0 0    Past Medical History:  Diagnosis Date   Depression     Past Surgical History:  Procedure Laterality Date   None      Current Outpatient Medications on File Prior to Visit  Medication Sig Dispense Refill   levonorgestrel  (MIRENA ) 20 MCG/DAY IUD 1 each by Intrauterine route once.     No current  facility-administered medications on file prior to visit.    Social History   Socioeconomic History   Marital status: Married    Spouse name: Not on file   Number of children: 0   Years of education: Not on file   Highest education level: Not on file  Occupational History   Occupation: Print production planner  Tobacco Use   Smoking status: Every Day    Current packs/day: 0.50    Average packs/day: 0.5 packs/day for 6.0 years (3.0 ttl pk-yrs)    Types: Cigarettes   Smokeless tobacco: Never  Vaping Use   Vaping status: Never Used  Substance and Sexual Activity   Alcohol use: Yes    Comment: one drink every few weeks   Drug use: Yes    Types: Marijuana    Comment: from time to time   Sexual activity: Yes    Birth control/protection: I.U.D.    Comment: Mirena  2022  Other Topics Concern   Not on file  Social History Narrative   Not on file   Social Drivers of Health   Financial Resource Strain: Not on file  Food Insecurity: Not on file  Transportation Needs: Not on file  Physical Activity: Not on file  Stress: Not on file  Social Connections: Not on file  Intimate Partner Violence: Not on file    Family History  Problem Relation Age of Onset   Alcohol abuse Mother        recovering alcoholic   Diabetes Mother    Colon cancer Paternal Grandmother    Breast cancer Paternal Grandmother      Allergies  Allergen Reactions   Penicillins Other (See Comments)    Never has taken but 2 sisters with reactions      Patient's last menstrual period was No LMP recorded. (Menstrual status: IUD)..             Review of Systems Alls systems reviewed and are negative.     Physical Exam Constitutional:      Appearance: Normal appearance.  Genitourinary:     Vulva and urethral meatus normal.     No lesions in the vagina.     Genitourinary Comments: IUD strings not seen     Right Labia: No rash, lesions or skin changes.    Left Labia: No lesions, skin changes or rash.     No vaginal discharge or tenderness.     No vaginal prolapse present.    No vaginal atrophy present.     Right Adnexa:  not tender, not palpable and no mass present.    Left Adnexa: not tender, not palpable and no mass present.    No cervical motion tenderness or discharge.     Uterus is not enlarged, tender or irregular.  Breasts:    Right: Normal.     Left: Normal.  HENT:     Head: Normocephalic.  Neck:     Thyroid: No thyroid mass, thyromegaly or thyroid tenderness.  Cardiovascular:     Rate and Rhythm: Normal rate and regular rhythm.     Heart sounds: Normal heart sounds, S1 normal and S2 normal.  Pulmonary:     Effort: Pulmonary effort is normal.     Breath sounds: Normal breath sounds and air entry.  Abdominal:     General: There is no distension.     Palpations: Abdomen is soft. There is no mass.     Tenderness: There is no abdominal tenderness. There is no guarding or rebound.  Musculoskeletal:        General: Normal range of motion.     Cervical back: Full passive range of motion without pain, normal range of motion and neck supple. No tenderness.     Right lower leg: No edema.     Left lower leg: No edema.  Neurological:     Mental Status: She is alert.  Skin:    General: Skin is warm.  Psychiatric:        Mood and Affect: Mood normal.        Behavior: Behavior normal.        Thought Content: Thought content normal.  Vitals and nursing note reviewed. Exam conducted with a chaperone present.       A:         Well Woman GYN exam, mirena  IUD 2022, h/o CINIII                             P:        Pap smear collected today Encouraged annual mammogram screening  Labs and immunizations to do with PMD Discussed breast self exams Encouraged healthy lifestyle practices Encouraged Vit D and Calcium   No follow-ups on file.  Reinaldo Caras

## 2023-08-15 ENCOUNTER — Encounter: Payer: Self-pay | Admitting: Obstetrics and Gynecology

## 2023-08-15 LAB — SURESWAB® ADVANCED VAGINITIS PLUS,TMA
C. trachomatis RNA, TMA: NOT DETECTED
CANDIDA SPECIES: DETECTED — AB
Candida glabrata: NOT DETECTED
N. gonorrhoeae RNA, TMA: NOT DETECTED
SURESWAB(R) ADV BACTERIAL VAGINOSIS(BV),TMA: NEGATIVE
TRICHOMONAS VAGINALIS (TV),TMA: NOT DETECTED

## 2023-08-17 LAB — CYTOLOGY - PAP
Comment: NEGATIVE
Diagnosis: NEGATIVE
High risk HPV: NEGATIVE
